# Patient Record
Sex: Female | Born: 2000 | Race: Black or African American | Hispanic: No | Marital: Single | Smoking: Never smoker
Health system: Southern US, Community
[De-identification: ages and names within clinical notes are randomized; demographics above are authoritative.]

## PROBLEM LIST (undated history)

## (undated) DIAGNOSIS — F32A Depression, unspecified: Secondary | ICD-10-CM

## (undated) DIAGNOSIS — S92909A Unspecified fracture of unspecified foot, initial encounter for closed fracture: Secondary | ICD-10-CM

## (undated) DIAGNOSIS — R45851 Suicidal ideations: Secondary | ICD-10-CM

## (undated) HISTORY — PX: ORTHOPEDIC SURGERY: SHX850

---
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
## 2020-01-17 ENCOUNTER — Other Ambulatory Visit (INDEPENDENT_AMBULATORY_CARE_PROVIDER_SITE_OTHER): Payer: Self-pay | Admitting: Family Medicine

## 2020-02-04 ENCOUNTER — Other Ambulatory Visit (INDEPENDENT_AMBULATORY_CARE_PROVIDER_SITE_OTHER): Payer: Self-pay | Admitting: Pediatric Infectious Disease

## 2020-02-04 ENCOUNTER — Other Ambulatory Visit (INDEPENDENT_AMBULATORY_CARE_PROVIDER_SITE_OTHER): Payer: Self-pay | Admitting: Family Medicine

## 2020-04-04 ENCOUNTER — Other Ambulatory Visit (INDEPENDENT_AMBULATORY_CARE_PROVIDER_SITE_OTHER): Payer: Self-pay | Admitting: Family Medicine

## 2020-04-04 DIAGNOSIS — N940 Mittelschmerz: Secondary | ICD-10-CM

## 2020-04-07 ENCOUNTER — Encounter (INDEPENDENT_AMBULATORY_CARE_PROVIDER_SITE_OTHER): Payer: Self-pay | Admitting: Family Medicine

## 2020-04-07 ENCOUNTER — Other Ambulatory Visit (INDEPENDENT_AMBULATORY_CARE_PROVIDER_SITE_OTHER): Payer: Self-pay | Admitting: Family Medicine

## 2020-04-07 DIAGNOSIS — N940 Mittelschmerz: Secondary | ICD-10-CM
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
## 2020-04-10 ENCOUNTER — Ambulatory Visit (INDEPENDENT_AMBULATORY_CARE_PROVIDER_SITE_OTHER): Payer: TRICARE Prime—HMO | Admitting: Family Medicine

## 2020-04-10 ENCOUNTER — Encounter (INDEPENDENT_AMBULATORY_CARE_PROVIDER_SITE_OTHER): Payer: Self-pay | Admitting: Family Medicine

## 2020-04-10 VITALS — BP 107/73 | HR 69 | Temp 97.4°F | Wt 129.4 lb

## 2020-04-10 DIAGNOSIS — Z3009 Encounter for other general counseling and advice on contraception: Secondary | ICD-10-CM

## 2020-04-10 MED ORDER — NORGESTIMATE-ETH ESTRADIOL 0.25-35 MG-MCG PO TABS
1.0000 | ORAL_TABLET | Freq: Every day | ORAL | 11 refills | Status: DC
Start: 2020-04-10 — End: 2021-02-15

## 2020-04-10 MED ORDER — NORGESTIMATE-ETH ESTRADIOL 0.25-35 MG-MCG PO TABS
1.0000 | ORAL_TABLET | Freq: Every day | ORAL | 5 refills | Status: DC
Start: 2020-04-10 — End: 2020-04-10

## 2020-04-10 NOTE — Progress Notes (Signed)
Have you seen any specialists/other providers since your last visit with [place]?    No    Arm preference verified?   Yes    The patient is due for DTAp , HpV series

## 2020-04-10 NOTE — Patient Instructions (Signed)
Estarylla 28 Day Pack   Uses  This medicine is used for the following purposes:   acne   birth control   depression   endocrine disorder   menstrual problem  Instructions  This medicine may be taken with or without food.  It is very important that you take the medicine at about the same time every day. It will work best if you do this.  Keep the medicine at room temperature. Avoid heat and direct light.  If this is the first time that you are using this medicine, please speak with your doctor about when you should start this medicine.  If you are switching from a different birth control pill, start this medicine on the same day you would start your other birth control pack.  If you are using a birth control pack with 28 pills, start the first pill of a new pack on the day after the last pill of the previous pack.  If you are using a birth control pack that has 21 pills, wait 7 days after taking the last pill of the empty pack before you take the first pill of a new pack.  If you forget to take a dose, take it as soon as you remember. If it is time for your next dose, you are allowed to take 2 doses at once. Return to your normal dosing schedule on the following day. If you miss more than one dose, ask your doctor what you should do to get back on schedule.  Speak with your doctor about using additional forms of birth control if you forget one or more doses of your medicine.  Some patients may have mild vaginal bleeding or spotting while on this medicine. If this happens, do not stop taking this medicine. If the bleeding or spotting lasts more than a few days or becomes heavier, contact your doctor.  This medicine may cause dark patches to appear on your face. Avoid sunlight and use sunscreen lotion to minimize further darkening of these skin patches.  Please tell your doctor and pharmacist about all the medicines you take. Include both prescription and over-the-counter medicines. Also tell them about any  vitamins, herbal medicines, or anything else you take for your health.  This medicine may affect your blood sugar levels. If you have diabetes, talk to your doctor before changing the dose of your diabetes medicine.  This medicine may interfere with some lab test results. Be sure to tell all your healthcare providers that you are taking this medicine.  It is very important that you keep all appointments for medical exams and tests while on this medicine.  Cautions  Tell your doctor and pharmacist if you ever had an allergic reaction to a medicine. Symptoms of an allergic reaction can include trouble breathing, skin rash, itching, swelling, or severe dizziness.  Avoid smoking while on this medicine. Smoking may increase your risk for stroke, heart attack, blood clots, high blood pressure, and other diseases of the heart and blood vessels.  If you miss your period while on this medicine, contact your doctor.  Ask your doctor to show you how to perform a self breast examination. You should check your breasts once a month and report any changes to your doctor.  Talk to your doctor about getting a complete physical exam every year while on this medicine.  Use condoms to reduce the risk of sexually transmitted diseases.  Tell the doctor or pharmacist if you are pregnant, planning to be  pregnant, or breastfeeding.  Do not use this medicine if you are pregnant. If you become pregnant while on this medicine, contact your doctor immediately.  Do not take St. John's wort while on this medicine.  Ask your pharmacist if this medicine can interact with any of your other medicines. Be sure to tell them about all the medicines you take.  Please tell all your doctors and dentists that you are on this medicine before they provide care.  Do not start or stop any other medicines without first speaking to your doctor or pharmacist.  Do not share this medicine with anyone who has not been prescribed this medicine.  Side Effects  The  following is a list of some common side effects from this medicine. Please speak with your doctor about what you should do if you experience these or other side effects.   bloating   breast pain or swelling   swelling of the legs, feet, and hands   headaches   high blood pressure   menstruation changes (missed or fewer periods)   nausea   stomach upset or abdominal pain   vaginal bleeding or spotting between periods   vomiting   weight gain  Call your doctor or get medical help right away if you notice any of these more serious side effects:   severe or persistent abdominal pain   breast lumps   chest pain   changes in memory, mood, or thinking   depression or feeling sad   fainting   severe or persistent headache   jaw pain   sudden leg pain, swelling, warmth or redness   symptoms of liver damage (such as yellowing of skin or eyes, dark urine, unusual tiredness or weakness; severe stomach or back pain)   shortness of breath   symptoms of stroke (such as one-sided weakness, slurred speech, confusion)   blurring or changes of vision  A few people may have an allergic reactions to this medicine. Symptoms can include difficulty breathing, skin rash, itching, swelling, or severe dizziness. If you notice any of these symptoms, seek medical help quickly.  Extra  Please speak with your doctor, nurse, or pharmacist if you have any questions about this medicine.  [email or web address]  IMPORTANT NOTE: This document tells you briefly how to take your medicine, but it does not tell you all there is to know about it.Your doctor or pharmacist may give you other documents about your medicine. Please talk to them if you have any questions.Always follow their advice. There is a more complete description of this medicine available in [place].Scan this code on your smartphone or tablet or use the web address below. You can also ask your pharmacist for a printout. If you have any  questions, please ask your pharmacist.    2021 [employer]

## 2020-05-10 ENCOUNTER — Other Ambulatory Visit (INDEPENDENT_AMBULATORY_CARE_PROVIDER_SITE_OTHER): Payer: Self-pay | Admitting: Family Medicine

## 2020-05-10 DIAGNOSIS — Z3009 Encounter for other general counseling and advice on contraception: Secondary | ICD-10-CM

## 2020-06-04 ENCOUNTER — Other Ambulatory Visit (INDEPENDENT_AMBULATORY_CARE_PROVIDER_SITE_OTHER): Payer: Self-pay | Admitting: Family Medicine

## 2020-06-04 DIAGNOSIS — Z3009 Encounter for other general counseling and advice on contraception: Secondary | ICD-10-CM

## 2020-06-04 NOTE — Telephone Encounter (Signed)
Please look at last medication refill- there are 11 refills available

## 2020-06-13 ENCOUNTER — Other Ambulatory Visit (INDEPENDENT_AMBULATORY_CARE_PROVIDER_SITE_OTHER): Payer: Self-pay | Admitting: Family Medicine

## 2020-06-13 DIAGNOSIS — Z3009 Encounter for other general counseling and advice on contraception: Secondary | ICD-10-CM
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
## 2020-09-24 ENCOUNTER — Ambulatory Visit (INDEPENDENT_AMBULATORY_CARE_PROVIDER_SITE_OTHER): Payer: TRICARE Prime—HMO | Admitting: Nurse Practitioner

## 2020-09-24 ENCOUNTER — Encounter (INDEPENDENT_AMBULATORY_CARE_PROVIDER_SITE_OTHER): Payer: Self-pay | Admitting: Nurse Practitioner

## 2020-09-24 VITALS — BP 128/80 | HR 74 | Temp 97.3°F | Ht 60.63 in | Wt 129.8 lb

## 2020-09-24 DIAGNOSIS — G43009 Migraine without aura, not intractable, without status migrainosus: Secondary | ICD-10-CM

## 2020-09-24 LAB — FERRITIN: Ferritin: 11.4 ng/mL (ref 4.60–204.00)

## 2020-09-24 LAB — CBC AND DIFFERENTIAL
Absolute NRBC: 0 10*3/uL (ref 0.00–0.00)
Basophils Absolute Automated: 0.05 10*3/uL (ref 0.00–0.08)
Basophils Automated: 1.1 %
Eosinophils Absolute Automated: 0.15 10*3/uL (ref 0.00–0.44)
Eosinophils Automated: 3.4 %
Hematocrit: 37.8 % (ref 34.7–43.7)
Hgb: 12.6 g/dL (ref 11.4–14.8)
Immature Granulocytes Absolute: 0.01 10*3/uL (ref 0.00–0.07)
Immature Granulocytes: 0.2 %
Lymphocytes Absolute Automated: 2.57 10*3/uL (ref 0.42–3.22)
Lymphocytes Automated: 59.1 %
MCH: 29.3 pg (ref 25.1–33.5)
MCHC: 33.3 g/dL (ref 31.5–35.8)
MCV: 87.9 fL (ref 78.0–96.0)
MPV: 8.5 fL — ABNORMAL LOW (ref 8.9–12.5)
Monocytes Absolute Automated: 0.37 10*3/uL (ref 0.21–0.85)
Monocytes: 8.5 %
Neutrophils Absolute: 1.2 10*3/uL (ref 1.10–6.33)
Neutrophils: 27.7 %
Nucleated RBC: 0 /100 WBC (ref 0.0–0.0)
Platelets: 302 10*3/uL (ref 142–346)
RBC: 4.3 10*6/uL (ref 3.90–5.10)
RDW: 12 % (ref 11–15)
WBC: 4.35 10*3/uL (ref 3.10–9.50)

## 2020-09-24 LAB — LYME AB, TOTAL,REFLEX TO WESTERN BLOT (IGG & IGM): Lyme AB,Total,Reflx to WB(IGM): 0.19

## 2020-09-24 MED ORDER — AMITRIPTYLINE HCL 25 MG PO TABS
25.0000 mg | ORAL_TABLET | Freq: Every evening | ORAL | 1 refills | Status: DC
Start: 2020-09-24 — End: 2021-09-15

## 2020-09-24 MED ORDER — BUTALBITAL-ASPIRIN-CAFFEINE 50-325-40 MG PO CAPS
1.00 | ORAL_CAPSULE | ORAL | 2 refills | Status: DC | PRN
Start: 2020-09-24 — End: 2021-09-15

## 2020-09-24 NOTE — Progress Notes (Signed)
Have you seen any specialists/other providers since your last visit with us?    No    Arm preference verified?   Yes    The patient is due for  HPV series

## 2020-09-25 NOTE — Progress Notes (Signed)
Hello Amanda Farrell, the test for lyme disease is negative.  The cbc and ferritin indicate that you do not have anemia.  Please schedule the CT scan of the head.  Have a headache free day!!! [name]

## 2020-10-16 ENCOUNTER — Ambulatory Visit: Payer: TRICARE Prime—HMO

## 2020-11-13 ENCOUNTER — Ambulatory Visit: Admission: RE | Admit: 2020-11-13 | Payer: TRICARE Prime—HMO | Source: Ambulatory Visit

## 2021-02-14 ENCOUNTER — Other Ambulatory Visit (INDEPENDENT_AMBULATORY_CARE_PROVIDER_SITE_OTHER): Payer: Self-pay | Admitting: Family Medicine

## 2021-02-14 DIAGNOSIS — Z3009 Encounter for other general counseling and advice on contraception: Secondary | ICD-10-CM

## 2021-05-29 ENCOUNTER — Other Ambulatory Visit (INDEPENDENT_AMBULATORY_CARE_PROVIDER_SITE_OTHER): Payer: Self-pay | Admitting: Nurse Practitioner

## 2021-07-19 ENCOUNTER — Emergency Department (HOSPITAL_COMMUNITY)

## 2021-07-19 ENCOUNTER — Other Ambulatory Visit: Payer: Self-pay

## 2021-07-19 ENCOUNTER — Encounter (HOSPITAL_COMMUNITY): Payer: Self-pay | Admitting: Emergency Medicine

## 2021-07-19 ENCOUNTER — Emergency Department (HOSPITAL_COMMUNITY)
Admission: EM | Admit: 2021-07-19 | Discharge: 2021-07-19 | Disposition: A | Discharge status: Home / Self Care | Attending: Emergency Medicine | Admitting: Emergency Medicine

## 2021-07-19 DIAGNOSIS — Z20822 Contact with and (suspected) exposure to covid-19: Secondary | ICD-10-CM | POA: Diagnosis not present

## 2021-07-19 DIAGNOSIS — R0602 Shortness of breath: Secondary | ICD-10-CM

## 2021-07-19 DIAGNOSIS — T7840XA Allergy, unspecified, initial encounter: Secondary | ICD-10-CM

## 2021-07-19 DIAGNOSIS — H608X1 Other otitis externa, right ear: Secondary | ICD-10-CM | POA: Insufficient documentation

## 2021-07-19 DIAGNOSIS — H60501 Unspecified acute noninfective otitis externa, right ear: Secondary | ICD-10-CM

## 2021-07-19 LAB — CBC WITH DIFFERENTIAL/PLATELET
Abs Immature Granulocytes: 0.04 10*3/uL (ref 0.00–0.07)
Basophils Absolute: 0 10*3/uL (ref 0.0–0.1)
Basophils Relative: 0 %
Eosinophils Absolute: 0.1 10*3/uL (ref 0.0–0.5)
Eosinophils Relative: 1 %
HCT: 33.5 % — ABNORMAL LOW (ref 36.0–46.0)
Hemoglobin: 11.7 g/dL — ABNORMAL LOW (ref 12.0–15.0)
Immature Granulocytes: 0 %
Lymphocytes Relative: 15 %
Lymphs Abs: 1.5 10*3/uL (ref 0.7–4.0)
MCH: 30.2 pg (ref 26.0–34.0)
MCHC: 34.9 g/dL (ref 30.0–36.0)
MCV: 86.3 fL (ref 80.0–100.0)
Monocytes Absolute: 0.6 10*3/uL (ref 0.1–1.0)
Monocytes Relative: 6 %
Neutro Abs: 7.7 10*3/uL (ref 1.7–7.7)
Neutrophils Relative %: 78 %
Platelets: 229 10*3/uL (ref 150–400)
RBC: 3.88 MIL/uL (ref 3.87–5.11)
RDW: 11.7 % (ref 11.5–15.5)
WBC: 10 10*3/uL (ref 4.0–10.5)
nRBC: 0 % (ref 0.0–0.2)

## 2021-07-19 LAB — BASIC METABOLIC PANEL
Anion gap: 7 (ref 5–15)
BUN: 5 mg/dL — ABNORMAL LOW (ref 6–20)
CO2: 23 mmol/L (ref 22–32)
Calcium: 8.9 mg/dL (ref 8.9–10.3)
Chloride: 108 mmol/L (ref 98–111)
Creatinine, Ser: 0.87 mg/dL (ref 0.44–1.00)
GFR, Estimated: >60 mL/min (ref >60)
Glucose, Bld: 78 mg/dL (ref 70–99)
Potassium: 3.9 mmol/L (ref 3.5–5.1)
Sodium: 138 mmol/L (ref 135–145)

## 2021-07-19 LAB — RESP PANEL BY RT-PCR (FLU A&B, COVID) ARPGX2
Influenza A by PCR: NEGATIVE
Influenza B by PCR: NEGATIVE
SARS Coronavirus 2 by RT PCR: NEGATIVE

## 2021-07-19 LAB — I-STAT BETA HCG BLOOD, ED (MC, WL, AP ONLY): I-stat hCG, quantitative: <5 m[IU]/mL (ref <5)

## 2021-07-19 LAB — GROUP A STREP BY PCR: Group A Strep by PCR: NOT DETECTED

## 2021-07-19 MED ORDER — DIPHENHYDRAMINE HCL 50 MG/ML IJ SOLN
25.0000 mg | Freq: Once | INTRAMUSCULAR | Status: AC
  Administered 2021-07-19: 25 mg via INTRAVENOUS
  Filled 2021-07-19: qty 1

## 2021-07-19 MED ORDER — FAMOTIDINE IN NACL 20-0.9 MG/50ML-% IV SOLN
20.0000 mg | Freq: Once | INTRAVENOUS | Status: AC
  Administered 2021-07-19: 20 mg via INTRAVENOUS
  Filled 2021-07-19: qty 50

## 2021-07-19 MED ORDER — DIPHENHYDRAMINE HCL 25 MG PO TABS: 25.0000 mg | ORAL_TABLET | Freq: Every evening | ORAL | 0 refills | Status: DC | PRN

## 2021-07-19 MED ORDER — PREDNISONE 10 MG PO TABS: 30.0000 mg | ORAL_TABLET | Freq: Every day | ORAL | 0 refills | Status: AC

## 2021-07-19 MED ORDER — ONDANSETRON HCL 4 MG/2ML IJ SOLN
4.0000 mg | Freq: Once | INTRAMUSCULAR | Status: AC
  Administered 2021-07-19: 4 mg via INTRAVENOUS
  Filled 2021-07-19: qty 2

## 2021-07-19 MED ORDER — ACETAMINOPHEN 500 MG PO TABS
1000.0000 mg | ORAL_TABLET | Freq: Once | ORAL | Status: AC
  Administered 2021-07-19: 1000 mg via ORAL
  Filled 2021-07-19: qty 2

## 2021-07-19 MED ORDER — CIPROFLOXACIN-DEXAMETHASONE 0.3-0.1 % OT SUSP
4.0000 [drp] | Freq: Once | OTIC | Status: AC
  Administered 2021-07-19: 4 [drp] via OTIC
  Filled 2021-07-19: qty 7.5

## 2021-07-19 MED ORDER — CETIRIZINE HCL 10 MG PO TABS: 10.0000 mg | ORAL_TABLET | Freq: Every day | ORAL | 0 refills | Status: DC

## 2021-07-19 MED ORDER — METHYLPREDNISOLONE SODIUM SUCC 125 MG IJ SOLR
125.0000 mg | Freq: Once | INTRAMUSCULAR | Status: AC
  Administered 2021-07-19: 125 mg via INTRAVENOUS
  Filled 2021-07-19: qty 2

## 2021-07-19 MED ORDER — CIPROFLOXACIN-DEXAMETHASONE 0.3-0.1 % OT SUSP: 4.0000 [drp] | Freq: Two times a day (BID) | OTIC | 0 refills | Status: DC

## 2021-07-19 NOTE — ED Notes (Signed)
Up to b/r with mother, steady gait. Blood and swabs sent.

## 2021-07-19 NOTE — ED Provider Notes (Signed)
[facility] EMERGENCY DEPARTMENT Provider Note   CSN: [id number] Arrival date & time: 07/19/21  7564     History Chief Complaint  Patient presents with   Shortness of Breath   Sore Throat    Erika Medina is a 20 y.o. female.  HPI Patient is a 20 year old female with no pertinent past medical history presented to the emergency room today with complaints of intermittent waxing waning shortness of breath, cough, sore throat, trouble swallowing--although able to tolerate p.o. fluids and food and pills--that started on Thursday and states that has been waxing and waning since.  She states that she feels that her face is puffy.  She states no history of anaphylaxis, asthma, allergies, atopic dermatitis. No recent new or unusual foods.  No recent animal exposure.  No recent travel.  No insect bites and no new medications started.  She denies any chest pain nausea vomiting diarrhea rashes No hemoptysis.  No other associate symptoms.  No aggravating mitigating factors.  Symptoms seem to wax and wane without provocation.    History reviewed. No pertinent past medical history.  There are no problems to display for this patient.   History reviewed. No pertinent surgical history.   OB History   No obstetric history on file.     No family history on file.  Social History   Tobacco Use   Smoking status: Never   Smokeless tobacco: Never  Substance Use Topics   Alcohol use: Not Currently   Drug use: Not Currently    Home Medications Prior to Admission medications   Medication Sig Start Date End Date Taking? Authorizing Provider  cetirizine (ZYRTEC ALLERGY) 10 MG tablet Take 1 tablet (10 mg total) by mouth daily. 07/19/21  Yes Jazzy Parmer, Stevphen Meuse S, PA  ciprofloxacin-dexamethasone (CIPRODEX) OTIC suspension Place 4 drops into the right ear 2 (two) times daily. 07/19/21  Yes Tylique Aull S, PA  diphenhydrAMINE (BENADRYL) 25 MG tablet Take 1 tablet (25 mg total) by  mouth at bedtime as needed. 07/19/21  Yes Wolf Boulay S, PA  predniSONE (DELTASONE) 10 MG tablet Take 3 tablets (30 mg total) by mouth daily for 4 days. 07/19/21 07/23/21 Yes Gailen Shelter, PA    Allergies    Patient has no allergy information on record.  Review of Systems   Review of Systems  Constitutional:  Positive for fatigue. Negative for chills and fever.  HENT:  Negative for congestion.        "Face swelling"  Eyes:  Negative for pain.  Respiratory:  Positive for cough and shortness of breath.   Cardiovascular:  Negative for chest pain and leg swelling.  Gastrointestinal:  Negative for abdominal pain, diarrhea, nausea and vomiting.  Genitourinary:  Negative for dysuria.  Musculoskeletal:  Negative for myalgias.  Skin:  Negative for rash.  Neurological:  Negative for dizziness and headaches.   Physical Exam Updated Vital Signs BP 131/77 (BP Location: Left Arm)   Pulse 74   Temp 98.9 F (37.2 C) (Oral)   Resp 18   LMP 07/05/2021   SpO2 98%   Physical Exam Vitals and nursing note reviewed.  Constitutional:      General: She is not in acute distress.    Comments: Anxious but well-appearing 20 year old female in no acute distress Speaking in full sentences.  Nontoxic-appearing.  HENT:     Head: Normocephalic and atraumatic.     Ears:     Comments: Right EAC with small ulcerative lesion is nonbleeding.  There  is some macerated tissue here.    Nose: Nose normal.     Mouth/Throat:     Comments: Mild posterior pharyngeal erythema with faint cobblestoning No tonsillar hypertrophy.  Uvula midline. Eyes:     General: No scleral icterus. Cardiovascular:     Rate and Rhythm: Normal rate and regular rhythm.     Pulses: Normal pulses.     Heart sounds: Normal heart sounds.  Pulmonary:     Effort: Pulmonary effort is normal. No respiratory distress.     Breath sounds: No wheezing.     Comments: Speaking in full sentences, no increased work of breathing, not  tachypneic, lungs clear to auscultation all fields  Initially some forced expiratory wheezing at end expiration however with coaxing patient ceased to do this. Abdominal:     Palpations: Abdomen is soft.     Tenderness: There is no abdominal tenderness. There is no guarding or rebound.  Musculoskeletal:     Cervical back: Normal range of motion.     Right lower leg: No edema.     Left lower leg: No edema.     Comments: No lower extremity edema  Skin:    General: Skin is warm and dry.     Capillary Refill: Capillary refill takes less than 2 seconds.     Comments: No rashes  Neurological:     Mental Status: She is alert. Mental status is at baseline.  Psychiatric:        Mood and Affect: Mood normal.        Behavior: Behavior normal.    ED Results / Procedures / Treatments   Labs (all labs ordered are listed, but only abnormal results are displayed) Labs Reviewed  BASIC METABOLIC PANEL - Abnormal; Notable for the following components:      Result Value   BUN 5 (*)    All other components within normal limits  CBC WITH DIFFERENTIAL/PLATELET - Abnormal; Notable for the following components:   Hemoglobin 11.7 (*)    HCT 33.5 (*)    All other components within normal limits  RESP PANEL BY RT-PCR (FLU A&B, COVID) ARPGX2  GROUP A STREP BY PCR  I-STAT BETA HCG BLOOD, ED ([facility], [facility], [facility] ONLY)    EKG None  Radiology DG Chest Port 1 View  Result Date: 07/19/2021 CLINICAL DATA:  Headache, cough, body aches EXAM: PORTABLE CHEST 1 VIEW COMPARISON:  None. FINDINGS: The cardiomediastinal silhouette is normal. There is no focal consolidation or pulmonary edema. There is no pleural effusion or pneumothorax. There is no acute osseous abnormality. IMPRESSION: No radiographic evidence of acute cardiopulmonary process. Electronically Signed   By: Lesia Hausen M.D.   On: 07/19/2021 09:50    Procedures Procedures   Medications Ordered in ED Medications  methylPREDNISolone sodium succinate  (SOLU-MEDROL) 125 mg/2 mL injection 125 mg (125 mg Intravenous Given 07/19/21 0959)  famotidine (PEPCID) IVPB 20 mg premix (0 mg Intravenous Stopped 07/19/21 1050)  ondansetron (ZOFRAN) injection 4 mg (4 mg Intravenous Given 07/19/21 0958)  diphenhydrAMINE (BENADRYL) injection 25 mg (25 mg Intravenous Given 07/19/21 0959)  acetaminophen (TYLENOL) tablet 1,000 mg (1,000 mg Oral Given 07/19/21 1235)  ciprofloxacin-dexamethasone (CIPRODEX) 0.3-0.1 % OTIC (EAR) suspension 4 drop (4 drops Right EAR Given 07/19/21 1435)    ED Course  I have reviewed the triage vital signs and the nursing notes.  Pertinent labs & imaging results that were available during my care of the patient were reviewed by me and considered in my medical decision  making (see chart for details).    MDM Rules/Calculators/A&P                          Patient is a well-appearing 20 year old female no past medical history presented to the ER today with complaints of which she describes as waxing and waning shortness of breath some cough sore throat has been ongoing for 4 days now.  No wheezing on examination no swelling of face no rashes no nausea or vomiting patient is not anaphylactic.  To provide her with some Pepcid, Benadryl some Tylenol and Zofran also given Solu-Medrol.  On my reassessment she feels significantly better.  States she has she has no symptoms at this time apart from some right ear pain which she just noticed.   I personally reviewed all laboratory work and imaging.  Metabolic panel without any acute abnormality specifically kidney function within normal limits and no significant electrolyte abnormalities. CBC without leukocytosis or significant anemia.   COVID/FLU/Strep negative.   Hcg negative for preg.   CXR negative   EKG nonischemic,  sinus arrhythmia.   Patient reassessed continues to have no respiratory abnormalities such as tachypnea, increased work of breathing or wheezing.  States he  feels completely resolved from the facial swelling.  Her face appears unchanged on my examination.  I am reassured that her symptoms are improved.  We will discharge patient home with short course of low intensity prednisone, Benadryl, Zyrtec and recommendations follow-up with allergist and PCP for  Also provide patient with Ciprodex for possible otitis externa.  Tabby Beaston was evaluated in Emergency Department on 07/19/2021 for the symptoms described in the history of present illness. She was evaluated in the context of the global COVID-19 pandemic, which necessitated consideration that the patient might be at risk for infection with the SARS-CoV-2 virus that causes COVID-19. Institutional protocols and algorithms that pertain to the evaluation of patients at risk for COVID-19 are in a state of rapid change based on information released by regulatory bodies including the CDC and federal and state organizations. These policies and algorithms were followed during the patient's care in the ED.   Final Clinical Impression(s) / ED Diagnoses Final diagnoses:  Allergic reaction, initial encounter  Acute otitis externa of right ear, unspecified type    Rx / DC Orders ED Discharge Orders          Ordered    cetirizine (ZYRTEC ALLERGY) 10 MG tablet  Daily        07/19/21 1410    diphenhydrAMINE (BENADRYL) 25 MG tablet  At bedtime PRN        07/19/21 1410    predniSONE (DELTASONE) 10 MG tablet  Daily        07/19/21 1410    ciprofloxacin-dexamethasone (CIPRODEX) OTIC suspension  2 times daily        07/19/21 1427             Solon [place]a [place], [place] 07/19/21 1524    Margarita Grizzle, MD 07/19/21 1525

## 2021-07-19 NOTE — ED Notes (Signed)
Lab contacted re: delay in BMP results running on machine per lab

## 2021-07-19 NOTE — ED Triage Notes (Signed)
Pt reports sore throat, difficulty swallowing, headache, cough, and body aches that started on Thursday and has worsened.  Pt now has facial swelling and throat swelling.  Wheezing.

## 2021-07-19 NOTE — ED Provider Notes (Signed)
Emergency Medicine Provider Triage Evaluation Note  Erika Medina , a 20 y.o. female  was evaluated in triage.  Pt complains of shortness of breath, cough, trouble swallowing.  Started on Thursday, had been worsening since then.  Also endorses some facial swelling.  No history of anaphylaxis or asthma, patient does have some allergies.  Denies any triggering foods, no new exposures or insect bites..  Review of Systems  Positive: Shortness of breath, cough, trouble swallowing, facial swelling Negative: Chest pain, nausea, vomiting, rash  Physical Exam  BP (!) 143/101 (BP Location: Left Arm)   Temp 98.4 F (36.9 C) (Oral)   Resp 19  Gen:   Awake, no distress   Resp:  Normal effort  MSK:   Moves extremities without difficulty  Other:  Some wheezing on exam, mild facial swelling.  Uvula midline.  Handling secretions appropriately.  Medical Decision Making  Medically screening exam initiated at 9:28 AM.  Appropriate orders placed.  Erika Medina was informed that the remainder of the evaluation will be completed by another provider, this initial triage assessment does not replace that evaluation, and the importance of remaining in the ED until their evaluation is complete.  Will treat as allergic reaction, does not meet 2 body system requirement for anaphylaxis.  Will also get chest x-ray given wheezing and COVID test and EKG for shortness of breath.     Erika Arista, PA-C 07/19/21 0932    Erika Core, MD 07/19/21 2119

## 2021-07-19 NOTE — ED Notes (Signed)
Pt alert, NAD, calm, interactive, resps e/u, speaking in squeaky hoarse voice. VSS/WDL. Xray at [facility] : [facility], mother at [facility]. C/o sore throat ear and sinus pain, and headache. Denies nausea, sob or itching.

## 2021-07-19 NOTE — ED Notes (Signed)
Feel better, but still having sharp pain in R ear.

## 2021-07-19 NOTE — Discharge Instructions (Addendum)
Please take the medications I prescribed you as directed. I have given you a short course of prednisone to take.  Please use Benadryl 25 mg at bedtime each night for the next 4 days Please take 1 Zyrtec each morning.  Please follow-up with your primary care provider.  You may always return to the ER for any new or concerning symptoms.  I have provided you with a school note for tomorrow as needed however you may return to school tomorrow if you feel well.

## 2021-07-19 NOTE — ED Notes (Signed)
Up to b/r, steady gait. Alert, NAD, calm.

## 2021-09-15 ENCOUNTER — Ambulatory Visit (INDEPENDENT_AMBULATORY_CARE_PROVIDER_SITE_OTHER): Payer: TRICARE Prime—HMO | Admitting: Nurse Practitioner

## 2021-09-15 ENCOUNTER — Encounter (INDEPENDENT_AMBULATORY_CARE_PROVIDER_SITE_OTHER): Payer: Self-pay | Admitting: Nurse Practitioner

## 2021-09-15 VITALS — BP 126/79 | HR 87 | Temp 98.0°F | Resp 16 | Ht 61.42 in | Wt 151.2 lb

## 2021-09-15 DIAGNOSIS — R000 Tachycardia, unspecified: Secondary | ICD-10-CM

## 2021-09-15 DIAGNOSIS — E162 Hypoglycemia, unspecified: Secondary | ICD-10-CM

## 2021-09-15 DIAGNOSIS — L259 Unspecified contact dermatitis, unspecified cause: Secondary | ICD-10-CM | POA: Insufficient documentation

## 2021-09-15 DIAGNOSIS — J029 Acute pharyngitis, unspecified: Secondary | ICD-10-CM | POA: Insufficient documentation

## 2021-09-15 DIAGNOSIS — D573 Sickle-cell trait: Secondary | ICD-10-CM

## 2021-09-15 DIAGNOSIS — D649 Anemia, unspecified: Secondary | ICD-10-CM

## 2021-09-15 DIAGNOSIS — H9190 Unspecified hearing loss, unspecified ear: Secondary | ICD-10-CM | POA: Insufficient documentation

## 2021-09-15 DIAGNOSIS — G43009 Migraine without aura, not intractable, without status migrainosus: Secondary | ICD-10-CM

## 2021-09-15 DIAGNOSIS — Z0000 Encounter for general adult medical examination without abnormal findings: Secondary | ICD-10-CM

## 2021-09-15 LAB — CBC AND DIFFERENTIAL
Absolute NRBC: 0 10*3/uL (ref 0.00–0.00)
Basophils Absolute Automated: 0.02 10*3/uL (ref 0.00–0.08)
Basophils Automated: 0.4 %
Eosinophils Absolute Automated: 0.11 10*3/uL (ref 0.00–0.44)
Eosinophils Automated: 2.3 %
Hematocrit: 36.1 % (ref 34.7–43.7)
Hgb: 12.4 g/dL (ref 11.4–14.8)
Immature Granulocytes Absolute: 0.01 10*3/uL (ref 0.00–0.07)
Immature Granulocytes: 0.2 %
Lymphocytes Absolute Automated: 2 10*3/uL (ref 0.42–3.22)
Lymphocytes Automated: 42.5 %
MCH: 29.6 pg (ref 25.1–33.5)
MCHC: 34.3 g/dL (ref 31.5–35.8)
MCV: 86.2 fL (ref 78.0–96.0)
MPV: 8.4 fL — ABNORMAL LOW (ref 8.9–12.5)
Monocytes Absolute Automated: 0.34 10*3/uL (ref 0.21–0.85)
Monocytes: 7.2 %
Neutrophils Absolute: 2.23 10*3/uL (ref 1.10–6.33)
Neutrophils: 47.4 %
Nucleated RBC: 0 /100 WBC (ref 0.0–0.0)
Platelets: 273 10*3/uL (ref 142–346)
RBC: 4.19 10*6/uL (ref 3.90–5.10)
RDW: 12 % (ref 11–15)
WBC: 4.71 10*3/uL (ref 3.10–9.50)

## 2021-09-15 LAB — ECG 12-LEAD
Atrial Rate: 91 {beats}/min
IHS MUSE NARRATIVE AND IMPRESSION: NORMAL
P Axis: 70 degrees
P-R Interval: 138 ms
Q-T Interval: 374 ms
QRS Duration: 78 ms
QTC Calculation (Bezet): 460 ms
R Axis: 47 degrees
T Axis: 32 degrees
Ventricular Rate: 91 {beats}/min

## 2021-09-15 LAB — COMPREHENSIVE METABOLIC PANEL
ALT: 10 U/L (ref 0–55)
AST (SGOT): 16 U/L (ref 5–41)
Albumin/Globulin Ratio: 1.3 (ref 0.9–2.2)
Albumin: 4.1 g/dL (ref 3.5–5.0)
Alkaline Phosphatase: 50 U/L (ref 37–117)
Anion Gap: 5 (ref 5.0–15.0)
BUN: 5 mg/dL — ABNORMAL LOW (ref 7.0–21.0)
Bilirubin, Total: 0.4 mg/dL (ref 0.2–1.2)
CO2: 26 mEq/L (ref 17–29)
Calcium: 9.6 mg/dL (ref 8.5–10.5)
Chloride: 107 mEq/L (ref 99–111)
Creatinine: 0.8 mg/dL (ref 0.4–1.0)
Globulin: 3.1 g/dL (ref 2.0–3.6)
Glucose: 80 mg/dL (ref 70–100)
Potassium: 4.2 mEq/L (ref 3.5–5.3)
Protein, Total: 7.2 g/dL (ref 6.0–8.3)
Sodium: 138 mEq/L (ref 135–145)

## 2021-09-15 LAB — HBA1C WITH AEG FOR HEMOGLOBINOPATHY PATIENTS
Average Estimated Glucose - Hemoglobinopathy Patient: 96.8 mg/dL
Hemoglobin A1C by Boronate Affinity: 5 % (ref 4.6–5.9)

## 2021-09-15 LAB — GFR: EGFR: >60

## 2021-09-15 LAB — FERRITIN: Ferritin: 12.4 ng/mL (ref 4.60–204.00)

## 2021-09-15 LAB — HEMOLYSIS INDEX: Hemolysis Index: 6 Index (ref 0–24)
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
## 2021-09-16 ENCOUNTER — Encounter (INDEPENDENT_AMBULATORY_CARE_PROVIDER_SITE_OTHER): Payer: Self-pay | Admitting: Family Medicine

## 2021-09-16 NOTE — Progress Notes (Signed)
Hello Amanda Farrell, The HA1C indicates no current evidence of diabetes or prediabetes.   The cmp indicates normal glucose electrolytes and liver and kidney function.  The cbc and ferritin indicate no current signs of anemia or infection. Have a nice day, [name]

## 2021-09-20 LAB — FRUCTOSAMINE: Fructosamine: 265 umol/L (ref 205–285)

## 2021-10-06 ENCOUNTER — Ambulatory Visit
Admission: RE | Admit: 2021-10-06 | Discharge: 2021-10-06 | Disposition: A | Discharge status: Home / Self Care | Payer: TRICARE Prime—HMO | Source: Ambulatory Visit | Attending: Nurse Practitioner | Admitting: Nurse Practitioner

## 2021-10-06 ENCOUNTER — Other Ambulatory Visit: Payer: Self-pay | Admitting: Nurse Practitioner

## 2021-10-06 DIAGNOSIS — G43009 Migraine without aura, not intractable, without status migrainosus: Secondary | ICD-10-CM | POA: Insufficient documentation

## 2021-10-07 NOTE — Progress Notes (Signed)
Hello Amanda Farrell, the MRI was free of any suspicious findings.  Please follow up with neurology and bring a copy of the MRI with you to the appointment. [address], [name]

## 2021-12-21 ENCOUNTER — Other Ambulatory Visit (INDEPENDENT_AMBULATORY_CARE_PROVIDER_SITE_OTHER): Payer: Self-pay | Admitting: Family Medicine

## 2021-12-21 DIAGNOSIS — Z3009 Encounter for other general counseling and advice on contraception: Secondary | ICD-10-CM

## 2021-12-22 MED ORDER — NORGESTIMATE-ETH ESTRADIOL 0.25-35 MG-MCG PO TABS
1.0000 | ORAL_TABLET | Freq: Every day | ORAL | 2 refills | Status: DC
Start: 2021-12-22 — End: 2022-03-29

## 2021-12-22 NOTE — Telephone Encounter (Signed)
Last visit 09/15/21    Rx refilled.

## 2021-12-29 ENCOUNTER — Other Ambulatory Visit (INDEPENDENT_AMBULATORY_CARE_PROVIDER_SITE_OTHER): Payer: Self-pay | Admitting: Nurse Practitioner

## 2021-12-29 DIAGNOSIS — Z3009 Encounter for other general counseling and advice on contraception: Secondary | ICD-10-CM

## 2021-12-30 NOTE — Telephone Encounter (Signed)
Mychart message sent.

## 2022-02-23 ENCOUNTER — Emergency Department
Admission: EM | Admit: 2022-02-23 | Discharge: 2022-02-24 | Disposition: A | Discharge status: Home / Self Care | Attending: Student in an Organized Health Care Education/Training Program | Admitting: Student in an Organized Health Care Education/Training Program

## 2022-02-23 DIAGNOSIS — H6692 Otitis media, unspecified, left ear: Secondary | ICD-10-CM | POA: Insufficient documentation

## 2022-02-23 DIAGNOSIS — R112 Nausea with vomiting, unspecified: Secondary | ICD-10-CM | POA: Insufficient documentation

## 2022-02-23 DIAGNOSIS — H6690 Otitis media, unspecified, unspecified ear: Secondary | ICD-10-CM

## 2022-02-23 LAB — HCG, SERUM, QUALITATIVE: Hcg Qualitative: NEGATIVE

## 2022-02-23 LAB — CBC AND DIFFERENTIAL
Absolute NRBC: 0 10*3/uL (ref 0.00–0.00)
Basophils Absolute Automated: 0.03 10*3/uL (ref 0.00–0.08)
Basophils Automated: 0.3 %
Eosinophils Absolute Automated: 0.07 10*3/uL (ref 0.00–0.44)
Eosinophils Automated: 0.8 %
Hematocrit: 34 % — ABNORMAL LOW (ref 34.7–43.7)
Hgb: 12.1 g/dL (ref 11.4–14.8)
Immature Granulocytes Absolute: 0.02 10*3/uL (ref 0.00–0.07)
Immature Granulocytes: 0.2 %
Instrument Absolute Neutrophil Count: 7.33 10*3/uL — ABNORMAL HIGH (ref 1.10–6.33)
Lymphocytes Absolute Automated: 0.92 10*3/uL (ref 0.42–3.22)
Lymphocytes Automated: 10.5 %
MCH: 29.5 pg (ref 25.1–33.5)
MCHC: 35.6 g/dL (ref 31.5–35.8)
MCV: 82.9 fL (ref 78.0–96.0)
MPV: 8.3 fL — ABNORMAL LOW (ref 8.9–12.5)
Monocytes Absolute Automated: 0.39 10*3/uL (ref 0.21–0.85)
Monocytes: 4.5 %
Neutrophils Absolute: 7.33 10*3/uL — ABNORMAL HIGH (ref 1.10–6.33)
Neutrophils: 83.7 %
Nucleated RBC: 0 /100 WBC (ref 0.0–0.0)
Platelets: 243 10*3/uL (ref 142–346)
RBC: 4.1 10*6/uL (ref 3.90–5.10)
RDW: 12 % (ref 11–15)
WBC: 8.76 10*3/uL (ref 3.10–9.50)

## 2022-02-23 LAB — BASIC METABOLIC PANEL
Anion Gap: 10 (ref 5.0–15.0)
BUN: 6 mg/dL — ABNORMAL LOW (ref 7.0–21.0)
CO2: 22 mEq/L (ref 17–29)
Calcium: 9.6 mg/dL (ref 8.5–10.5)
Chloride: 106 mEq/L (ref 99–111)
Creatinine: 0.9 mg/dL (ref 0.4–1.0)
Glucose: 80 mg/dL (ref 70–100)
Potassium: 4.1 mEq/L (ref 3.5–5.3)
Sodium: 138 mEq/L (ref 135–145)
eGFR: >60 mL/min/{1.73_m2} (ref >=60)

## 2022-02-23 MED ORDER — SODIUM CHLORIDE 0.9 % IV BOLUS
1000.0000 mL | Freq: Once | INTRAVENOUS | Status: AC
Start: 2022-02-23 — End: 2022-02-24
  Administered 2022-02-23: 1000 mL via INTRAVENOUS

## 2022-02-23 MED ORDER — ONDANSETRON HCL 4 MG/2ML IJ SOLN
4.0000 mg | Freq: Once | INTRAMUSCULAR | Status: AC
Start: 2022-02-23 — End: 2022-02-23
  Administered 2022-02-23: 4 mg via INTRAVENOUS
  Filled 2022-02-23: qty 2

## 2022-02-23 MED ORDER — AMOXICILLIN 500 MG PO CAPS
500.0000 mg | ORAL_CAPSULE | Freq: Once | ORAL | Status: AC
Start: 2022-02-23 — End: 2022-02-24
  Administered 2022-02-24: 500 mg via ORAL
  Filled 2022-02-23: qty 1
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
## 2022-02-23 NOTE — ED Triage Notes (Signed)
Presented with projectile vomiting for about 2 days as per patient. Had eaten mac n cheese and bread prior to symptoms started

## 2022-02-24 MED ORDER — SODIUM CHLORIDE 0.9 % IV SOLN
12.5000 mg | Freq: Once | INTRAVENOUS | Status: AC
Start: 2022-02-24 — End: 2022-02-24
  Administered 2022-02-24: 12.5 mg via INTRAVENOUS
  Filled 2022-02-24: qty 12.5

## 2022-02-24 MED ORDER — PROMETHAZINE HCL 12.5 MG PO TABS
12.5000 mg | ORAL_TABLET | Freq: Four times a day (QID) | ORAL | 0 refills | Status: AC | PRN
Start: 2022-02-24 — End: ?

## 2022-02-24 MED ORDER — AMOXICILLIN 500 MG PO CAPS
500.0000 mg | ORAL_CAPSULE | Freq: Three times a day (TID) | ORAL | 0 refills | Status: AC
Start: 2022-02-24 — End: 2022-03-06

## 2022-02-26 ENCOUNTER — Telehealth (INDEPENDENT_AMBULATORY_CARE_PROVIDER_SITE_OTHER): Payer: Self-pay | Admitting: Nurse Practitioner

## 2022-02-26 NOTE — Telephone Encounter (Signed)
Attempted to call patient for ED follow up visit. LMTCB.

## 2022-03-18 ENCOUNTER — Ambulatory Visit: Admitting: Neurology

## 2022-03-29 ENCOUNTER — Other Ambulatory Visit (INDEPENDENT_AMBULATORY_CARE_PROVIDER_SITE_OTHER): Payer: Self-pay | Admitting: Family Medicine

## 2022-03-29 DIAGNOSIS — Z3009 Encounter for other general counseling and advice on contraception: Secondary | ICD-10-CM

## 2022-03-29 NOTE — Telephone Encounter (Signed)
LOV- 09/15/21 advised to " Return if symptoms worsen or fail to improve."      Rx refilled

## 2022-04-16 ENCOUNTER — Ambulatory Visit (INDEPENDENT_AMBULATORY_CARE_PROVIDER_SITE_OTHER): Admitting: Family

## 2022-04-16 ENCOUNTER — Encounter (INDEPENDENT_AMBULATORY_CARE_PROVIDER_SITE_OTHER): Payer: Self-pay | Admitting: Family

## 2022-04-16 VITALS — BP 123/87 | HR 80 | Temp 97.8°F | Wt 140.2 lb

## 2022-04-16 DIAGNOSIS — E162 Hypoglycemia, unspecified: Secondary | ICD-10-CM

## 2022-04-16 LAB — COMPREHENSIVE METABOLIC PANEL
ALT: 6 U/L (ref 0–55)
AST (SGOT): 16 U/L (ref 5–41)
Albumin/Globulin Ratio: 1.4 (ref 0.9–2.2)
Albumin: 4.1 g/dL (ref 3.5–5.0)
Alkaline Phosphatase: 50 U/L (ref 37–117)
Anion Gap: 12 (ref 5.0–15.0)
BUN: 6 mg/dL — ABNORMAL LOW (ref 7.0–21.0)
Bilirubin, Total: 0.5 mg/dL (ref 0.2–1.2)
CO2: 20 mEq/L (ref 17–29)
Calcium: 9.5 mg/dL (ref 8.5–10.5)
Chloride: 105 mEq/L (ref 99–111)
Creatinine: 0.8 mg/dL (ref 0.4–1.0)
Globulin: 2.9 g/dL (ref 2.0–3.6)
Glucose: 132 mg/dL — ABNORMAL HIGH (ref 70–100)
Potassium: 3.5 mEq/L (ref 3.5–5.3)
Protein, Total: 7 g/dL (ref 6.0–8.3)
Sodium: 137 mEq/L (ref 135–145)
eGFR: >60 mL/min/{1.73_m2} (ref >=60)

## 2022-04-16 LAB — HEMOLYSIS INDEX: Hemolysis Index: 10 Index (ref 0–24)
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
## 2022-04-17 LAB — CBC AND DIFFERENTIAL
Absolute NRBC: 0 10*3/uL (ref 0.00–0.00)
Basophils Absolute Automated: 0.04 10*3/uL (ref 0.00–0.08)
Basophils Automated: 0.8 %
Eosinophils Absolute Automated: 0.03 10*3/uL (ref 0.00–0.44)
Eosinophils Automated: 0.6 %
Hematocrit: 33.7 % — ABNORMAL LOW (ref 34.7–43.7)
Hgb: 11.8 g/dL (ref 11.4–14.8)
Immature Granulocytes Absolute: 0.01 10*3/uL (ref 0.00–0.07)
Immature Granulocytes: 0.2 %
Instrument Absolute Neutrophil Count: 3.13 10*3/uL (ref 1.10–6.33)
Lymphocytes Absolute Automated: 1.68 10*3/uL (ref 0.42–3.22)
Lymphocytes Automated: 32.7 %
MCH: 29.4 pg (ref 25.1–33.5)
MCHC: 35 g/dL (ref 31.5–35.8)
MCV: 84 fL (ref 78.0–96.0)
MPV: 8.7 fL — ABNORMAL LOW (ref 8.9–12.5)
Monocytes Absolute Automated: 0.25 10*3/uL (ref 0.21–0.85)
Monocytes: 4.9 %
Neutrophils Absolute: 3.13 10*3/uL (ref 1.10–6.33)
Neutrophils: 60.8 %
Nucleated RBC: 0 /100 WBC (ref 0.0–0.0)
Platelets: 265 10*3/uL (ref 142–346)
RBC: 4.01 10*6/uL (ref 3.90–5.10)
RDW: 12 % (ref 11–15)
WBC: 5.14 10*3/uL (ref 3.10–9.50)

## 2022-04-17 LAB — HEMOGLOBIN A1C
Average Estimated Glucose: 85.3 mg/dL
Hemoglobin A1C: 4.6 % (ref 4.6–5.6)

## 2022-04-17 LAB — TSH: TSH: 0.54 u[IU]/mL (ref 0.35–4.94)

## 2022-06-10 ENCOUNTER — Other Ambulatory Visit (INDEPENDENT_AMBULATORY_CARE_PROVIDER_SITE_OTHER): Payer: Self-pay | Admitting: Nurse Practitioner

## 2022-06-17 ENCOUNTER — Ambulatory Visit: Admitting: Neurology

## 2022-06-18 ENCOUNTER — Other Ambulatory Visit (INDEPENDENT_AMBULATORY_CARE_PROVIDER_SITE_OTHER): Payer: Self-pay | Admitting: Family Medicine

## 2022-06-18 DIAGNOSIS — Z3009 Encounter for other general counseling and advice on contraception: Secondary | ICD-10-CM

## 2022-06-18 NOTE — Telephone Encounter (Signed)
LV- 09/15/21, Needs appt.

## 2022-07-03 ENCOUNTER — Other Ambulatory Visit (INDEPENDENT_AMBULATORY_CARE_PROVIDER_SITE_OTHER): Payer: Self-pay | Admitting: Family Medicine

## 2022-07-03 DIAGNOSIS — Z3009 Encounter for other general counseling and advice on contraception: Secondary | ICD-10-CM

## 2022-07-05 ENCOUNTER — Encounter (INDEPENDENT_AMBULATORY_CARE_PROVIDER_SITE_OTHER): Payer: Self-pay | Admitting: Family

## 2022-07-05 ENCOUNTER — Other Ambulatory Visit (INDEPENDENT_AMBULATORY_CARE_PROVIDER_SITE_OTHER): Payer: Self-pay | Admitting: Family Medicine

## 2022-07-05 DIAGNOSIS — Z3009 Encounter for other general counseling and advice on contraception: Secondary | ICD-10-CM

## 2022-07-05 MED ORDER — NORGESTIMATE-ETH ESTRADIOL 0.25-35 MG-MCG PO TABS
1.0000 | ORAL_TABLET | Freq: Every day | ORAL | 0 refills | Status: DC
Start: 2022-07-05 — End: 2022-07-20

## 2022-07-05 NOTE — Telephone Encounter (Signed)
Lv- 09/15/21, Needs transfer of care.

## 2022-07-05 NOTE — Addendum Note (Signed)
Addended by: Carney Bern on: 07/05/2022 04:12 PM     Modules accepted: Orders

## 2022-07-05 NOTE — Telephone Encounter (Signed)
LV- 09/15/21 with clair, needs transfer of care.    Message sent

## 2022-07-05 NOTE — Telephone Encounter (Signed)
Thirty days sent but pt needs to establish with a new provider prior to additional refills.

## 2022-07-06 NOTE — Telephone Encounter (Signed)
Message sent

## 2022-07-16 ENCOUNTER — Telehealth: Admitting: Physician Assistant

## 2022-07-16 DIAGNOSIS — Z3041 Encounter for surveillance of contraceptive pills: Secondary | ICD-10-CM | POA: Diagnosis not present

## 2022-07-16 MED ORDER — NORGESTIMATE-ETH ESTRADIOL 0.25-35 MG-MCG PO TABS: 1.0000 | ORAL_TABLET | Freq: Every day | ORAL | 0 refills | Status: AC

## 2022-07-16 NOTE — Patient Instructions (Signed)
Erika Medina, thank you for joining Margaretann Loveless, PA-C for today's virtual visit.  While this provider is not your primary care provider (PCP), if your PCP is located in our provider database this encounter information will be shared with them immediately following your visit.  A [facility] MyChart account gives you access to today's visit and all your visits, tests, and labs performed at [facility] " click here if you don't have a [facility] MyChart account or go to [email or web address]  Consent: (Patient) Erika Medina provided verbal consent for this virtual visit at the beginning of the encounter.  Current Medications:  Current Outpatient Medications:    norgestimate-ethinyl estradiol (ESTARYLLA) 0.25-35 MG-MCG tablet, Take 1 tablet by mouth daily., Disp: 84 tablet, Rfl: 0   cetirizine (ZYRTEC ALLERGY) 10 MG tablet, Take 1 tablet (10 mg total) by mouth daily., Disp: 10 tablet, Rfl: 0   ciprofloxacin-dexamethasone (CIPRODEX) OTIC suspension, Place 4 drops into the right ear 2 (two) times daily., Disp: 7.5 mL, Rfl: 0   diphenhydrAMINE (BENADRYL) 25 MG tablet, Take 1 tablet (25 mg total) by mouth at bedtime as needed., Disp: 10 tablet, Rfl: 0   Medications ordered in this encounter:  Meds ordered this encounter  Medications   norgestimate-ethinyl estradiol (ESTARYLLA) 0.25-35 MG-MCG tablet    Sig: Take 1 tablet by mouth daily.    Dispense:  84 tablet    Refill:  0    Order Specific Question:   Supervising Provider    Answer:   Merrilee Jansky [id number]     *If you need refills on other medications prior to your next appointment, please contact your pharmacy*  Follow-Up: Call back or seek an in-person evaluation if the symptoms worsen or if the condition fails to improve as anticipated.   Virtual Care [phone]  Other Instructions  Estarylla (Norgestimate; Ethinyl Estradiol Tablets) What is this medication? NORGESTIMATE; ETHINYL  ESTRADIOL (nor JES ti mate; ETH in il es tra DYE ole) prevents ovulation and pregnancy. It may also be used to treat acne. It belongs to a group of medications called oral contraceptives. It is a combination of the hormones estrogen and progestin. This medicine may be used for other purposes; ask your health care provider or pharmacist if you have questions. COMMON BRAND NAME(S): Estarylla, Mili, MONO-LINYAH, MonoNessa, Norgestimate/Ethinyl Estradiol, Ortho Tri-Cyclen, Ortho Tri-Cyclen Lo, Ortho-Cyclen, Previfem, Sprintec, Tri-Estarylla, TRI-LINYAH, Tri-Lo-Estarylla, Tri-Lo-Marzia, Tri-Lo-Mili, Tri-Lo-Sprintec, Tri-Mili, Tri-Previfem, Tri-Sprintec, Tri-VyLibra, Trinessa, Trinessa Althea Charon What should I tell my care team before I take this medication? They need to know if you have or ever had any of these conditions: Abnormal vaginal bleeding Blood vessel disease Breast, cervical, endometrial, ovarian, liver, or uterine cancer Diabetes Gallbladder disease Having surgery Heart disease or recent heart attack High blood pressure High cholesterol or triglycerides History of blood clots History of irregular heartbeat or heart valve problems History of a stroke Kidney disease Liver disease Migraine headaches Protein C deficiency Protein S deficiency Recently had a baby, miscarriage, or abortion Systemic lupus erythematosus (SLE) Tobacco smoker An unusual or allergic reaction to estrogens, progestins, other medications, foods, dyes, or preservatives Pregnant or trying to get pregnant Breast-feeding How should I use this medication? Take this medication by mouth. To reduce nausea, it can be taken with food. Follow the directions on the prescription label. Take this medication at the same time each day and in the order directed on the package. Do not take your medication more often than directed. Contact your care team about  the use of this medication in children. Special care may be needed.  This medication has been used in children who have started having menstrual periods. A patient package insert for the product will be given with each prescription and refill. Read this sheet carefully each time. The sheet may change frequently. Overdosage: If you think you have taken too much of this medicine contact a poison control center or emergency room at once. NOTE: This medicine is only for you. Do not share this medicine with others. What if I miss a dose? If you miss a dose, refer to the patient information sheet you received with your medication for directions on what to do. If you miss more than one dose, this medication may not be as effective, and you may need to use another form of birth control. What may interact with this medication? Do not take this medication with the following: Dasabuvir; ombitasvir; paritaprevir; ritonavir Ombitasvir; paritaprevir; ritonavir This medication may also interact with the following: Acetaminophen Antibiotics or medications for infections, especially rifampin, rifabutin, rifapentine, and griseofulvin, and possibly penicillins or tetracyclines Aprepitant Ascorbic acid (vitamin C) Atorvastatin Barbiturate medications, such as phenobarbital Bosentan Carbamazepine Caffeine Clofibrate Cyclosporine Dantrolene Doxercalciferol Felbamate Grapefruit juice Hydrocortisone Medications for anxiety or sleeping problems, such as diazepam or temazepam Medications for diabetes, including pioglitazone Mineral oil Modafinil Mycophenolate Nefazodone Oxcarbazepine Phenytoin Prednisolone Raloxifene Ritonavir or other medications for HIV infection or AIDS Rosuvastatin Selegiline Soy isoflavones supplements St. John's wort Tamoxifen Theophylline Thyroid hormones Topiramate Warfarin This list may not describe all possible interactions. Give your health care provider a list of all the medicines, herbs, non-prescription drugs, or dietary supplements  you use. Also tell them if you smoke, drink alcohol, or use illegal drugs. Some items may interact with your medicine. What should I watch for while using this medication? Visit your health care provider for regular checks on your progress. You will need a regular breast and pelvic exam and Pap smear while on this medication. You should also discuss the need for regular mammograms with your health care provider, and follow his or her guidelines for these tests. This medication can make your body retain fluid, making your fingers, hands, or ankles swell. Your blood pressure can go up. Contact your health care provider if you feel you are retaining fluid. Use an additional method of contraception during the first cycle that you take these tablets. If you have any reason to think you are pregnant, stop taking this medication right away and contact your health care provider. If you are taking this medication for hormone related problems, it may take several cycles to see improvement in your condition. Do not use this product if you smoke and are over 5 years of age. Smoking increases the risk of getting a blood clot or having a stroke while you are taking birth control pills, especially if you are more than 21 years old. If you are a smoker who is 61 years of age or younger, you are strongly advised not to smoke while taking birth control pills. This medication can make you more sensitive to the sun. Keep out of the sun. If you cannot avoid being in the sun, wear protective clothing and use sunscreen. Do not use sun lamps or tanning beds/booths. If you wear contact lenses and notice visual changes, or if the lenses begin to feel uncomfortable, consult your eye care specialist. In some women, tenderness, swelling, or minor bleeding of the gums may occur. Notify your dentist if this  happens. Brushing and flossing your teeth regularly may help limit this. See your dentist regularly and inform your dentist of the  medications you are taking. If you are going to have elective surgery, you may need to stop taking this medication before the surgery. Consult your health care provider for advice. This medication does not protect you against HIV infection (AIDS) or any other sexually transmitted diseases. What side effects may I notice from receiving this medication? Side effects that you should report to your care team as soon as possible: Allergic reactions--skin rash, itching, hives, swelling of the face, lips, tongue, or throat Blood clot--pain, swelling, or warmth in the leg, shortness of breath, chest pain Gallbladder problems--severe stomach pain, nausea, vomiting, fever Increase in blood pressure Liver injury--right upper belly pain, loss of appetite, nausea, light-colored stool, dark yellow or brown urine, yellowing skin or eyes, unusual weakness, fatigue New or worsening migraines or headaches Stroke--sudden numbness or weakness of the face, arm, or leg, trouble speaking, confusion, trouble walking, loss of balance or coordination, dizziness, severe headache, change in vision Unusual vaginal discharge, itching, or odor Worsening mood, feelings of depression Side effects that usually do not require medical attention (report these to your care team if they continue or are bothersome): Breast pain or tenderness Dark patches of skin on the face or other sun-exposed areas Irregular menstrual cycles or spotting Nausea Weight gain This list may not describe all possible side effects. Call your doctor for medical advice about side effects. You may report side effects to FDA at 1-800-FDA-1088. Where should I keep my medication? Keep out of the reach of children and pets. Store at room temperature between 15 and 30 degrees C (59 and 86 degrees F). Get rid of any unused medication after the expiration date. To get rid of medications that are no longer needed or have expired: Take the medication to a  medication take-back program. Check with your pharmacy or law enforcement to find a location. If you cannot return the medication, check the label or package insert to see if the medication should be thrown out in the garbage or flushed down the toilet. If you are not sure, ask your care team. If it is safe to put it in the trash, empty the medication out of the container. Mix the medication with cat litter, dirt, coffee grounds, or other unwanted substance. Seal the mixture in a bag or container. Put it in the trash. NOTE: This sheet is a summary. It may not cover all possible information. If you have questions about this medicine, talk to your doctor, pharmacist, or health care provider.  2023 Elsevier/Gold Standard (2020-10-17 00:00:00)    If you have been instructed to have an in-person evaluation today at a local Urgent Care facility, please use the link below. It will take you to a list of all of our available [facility], including address, phone number and hours of operation. Please do not delay care.  [facility]  If you or a family member do not have a primary care provider, use the link below to schedule a visit and establish care. When you choose a [facility] primary care physician or advanced practice provider, you gain a long-term partner in health. Find a Primary Care Provider  Learn more about [facility] in-office and virtual care options: [facility]

## 2022-07-16 NOTE — Progress Notes (Signed)
Virtual Visit Consent   Erika Medina, you are scheduled for a virtual visit with a [facility] provider today. Just as with appointments in the office, your consent must be obtained to participate. Your consent will be active for this visit and any virtual visit you may have with one of our providers in the next 365 days. If you have a MyChart account, a copy of this consent can be sent to you electronically.  As this is a virtual visit, video technology does not allow for your provider to perform a traditional examination. This may limit your provider's ability to fully assess your condition. If your provider identifies any concerns that need to be evaluated in person or the need to arrange testing (such as labs, EKG, etc.), we will make arrangements to do so. Although advances in technology are sophisticated, we cannot ensure that it will always work on either your end or our end. If the connection with a video visit is poor, the visit may have to be switched to a telephone visit. With either a video or telephone visit, we are not always able to ensure that we have a secure connection.  By engaging in this virtual visit, you consent to the provision of healthcare and authorize for your insurance to be billed (if applicable) for the services provided during this visit. Depending on your insurance coverage, you may receive a charge related to this service.  I need to obtain your verbal consent now. Are you willing to proceed with your visit today? Diannia Hogenson has provided verbal consent on 07/16/2022 for a virtual visit (video or telephone). Mar Daring, PA-C  Date: 07/16/2022 4:31 PM  Virtual Visit via Video Note   I, Mar Daring, connected with  Erika Medina  ([id number], [birth year]) on 07/16/22 at  4:15 PM EDT by a video-enabled telemedicine application and verified that I am speaking with the correct person using two identifiers.  Location: Patient: Virtual Visit Location  Patient: Home Provider: Virtual Visit Location Provider: Home Office   I discussed the limitations of evaluation and management by telemedicine and the availability of in person appointments. The patient expressed understanding and agreed to proceed.    History of Present Illness: Erika Medina is a 21 y.o. who identifies as a female who was assigned female at birth, and is being seen today for refill of her OCP. She reports she has not had any true lapse in coverage as she just ran out 3 days ago. She reports she was given a one month emergency refill, but her previous prescriber has left and she has not been able to establish with someone new. She is currently on Estarylla and is tolerating well.    Problems: There are no problems to display for this patient.   Allergies: Not on File Medications:  Current Outpatient Medications:    norgestimate-ethinyl estradiol (ESTARYLLA) 0.25-35 MG-MCG tablet, Take 1 tablet by mouth daily., Disp: 84 tablet, Rfl: 0   cetirizine (ZYRTEC ALLERGY) 10 MG tablet, Take 1 tablet (10 mg total) by mouth daily., Disp: 10 tablet, Rfl: 0   ciprofloxacin-dexamethasone (CIPRODEX) OTIC suspension, Place 4 drops into the right ear 2 (two) times daily., Disp: 7.5 mL, Rfl: 0   diphenhydrAMINE (BENADRYL) 25 MG tablet, Take 1 tablet (25 mg total) by mouth at bedtime as needed., Disp: 10 tablet, Rfl: 0  Observations/Objective: Patient is well-developed, well-nourished in no acute distress.  Resting comfortably at home.  Head is normocephalic, atraumatic.  No labored breathing.  Speech is clear and coherent with logical content.  Patient is alert and oriented at baseline.    Assessment and Plan: 1. Encounter for repeat prescription of oral contraceptives - norgestimate-ethinyl estradiol (ESTARYLLA) 0.25-35 MG-MCG tablet; Take 1 tablet by mouth daily.  Dispense: 84 tablet; Refill: 0  - Medication refilled on emergency basis for 3 months to cover until she can  re-establish with a new provider for future, long-term refills; she voices understanding to establish with a new provider in the meantime  Follow Up Instructions: I discussed the assessment and treatment plan with the patient. The patient was provided an opportunity to ask questions and all were answered. The patient agreed with the plan and demonstrated an understanding of the instructions.  A copy of instructions were sent to the patient via MyChart unless otherwise noted below.    The patient was advised to call back or seek an in-person evaluation if the symptoms worsen or if the condition fails to improve as anticipated.  Time:  I spent 8 minutes with the patient via telehealth technology discussing the above problems/concerns.    Mar Daring, PA-C

## 2022-07-20 ENCOUNTER — Encounter (INDEPENDENT_AMBULATORY_CARE_PROVIDER_SITE_OTHER): Payer: Self-pay | Admitting: Family

## 2022-07-20 ENCOUNTER — Telehealth (INDEPENDENT_AMBULATORY_CARE_PROVIDER_SITE_OTHER): Payer: TRICARE Prime—HMO | Admitting: Family

## 2022-07-20 DIAGNOSIS — Z3009 Encounter for other general counseling and advice on contraception: Secondary | ICD-10-CM

## 2022-07-20 DIAGNOSIS — R000 Tachycardia, unspecified: Secondary | ICD-10-CM

## 2022-07-20 DIAGNOSIS — R0602 Shortness of breath: Secondary | ICD-10-CM

## 2022-07-20 MED ORDER — NORGESTIMATE-ETH ESTRADIOL 0.25-35 MG-MCG PO TABS
1.0000 | ORAL_TABLET | Freq: Every day | ORAL | 3 refills | Status: DC
Start: 2022-07-20 — End: 2023-09-05
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
## 2022-08-21 ENCOUNTER — Other Ambulatory Visit: Payer: Self-pay

## 2022-08-21 ENCOUNTER — Encounter (HOSPITAL_COMMUNITY): Payer: Self-pay | Admitting: Emergency Medicine

## 2022-08-21 ENCOUNTER — Emergency Department (HOSPITAL_COMMUNITY)
Admission: EM | Admit: 2022-08-21 | Discharge: 2022-08-22 | Disposition: A | Discharge status: Home / Self Care | Attending: Emergency Medicine | Admitting: Emergency Medicine

## 2022-08-21 DIAGNOSIS — R197 Diarrhea, unspecified: Secondary | ICD-10-CM | POA: Diagnosis not present

## 2022-08-21 DIAGNOSIS — R112 Nausea with vomiting, unspecified: Secondary | ICD-10-CM | POA: Diagnosis present

## 2022-08-21 LAB — LIPASE, BLOOD: Lipase: 28 U/L (ref 11–51)

## 2022-08-21 LAB — CBC WITH DIFFERENTIAL/PLATELET
Abs Immature Granulocytes: 0.02 10*3/uL (ref 0.00–0.07)
Basophils Absolute: 0 10*3/uL (ref 0.0–0.1)
Basophils Relative: 0 %
Eosinophils Absolute: 0 10*3/uL (ref 0.0–0.5)
Eosinophils Relative: 0 %
HCT: 35 % — ABNORMAL LOW (ref 36.0–46.0)
Hemoglobin: 11.9 g/dL — ABNORMAL LOW (ref 12.0–15.0)
Immature Granulocytes: 0 %
Lymphocytes Relative: 27 %
Lymphs Abs: 2.1 10*3/uL (ref 0.7–4.0)
MCH: 29.9 pg (ref 26.0–34.0)
MCHC: 34 g/dL (ref 30.0–36.0)
MCV: 87.9 fL (ref 80.0–100.0)
Monocytes Absolute: 0.3 10*3/uL (ref 0.1–1.0)
Monocytes Relative: 4 %
Neutro Abs: 5.1 10*3/uL (ref 1.7–7.7)
Neutrophils Relative %: 69 %
Platelets: 249 10*3/uL (ref 150–400)
RBC: 3.98 MIL/uL (ref 3.87–5.11)
RDW: 12.6 % (ref 11.5–15.5)
WBC: 7.6 10*3/uL (ref 4.0–10.5)
nRBC: 0 % (ref 0.0–0.2)

## 2022-08-21 LAB — URINALYSIS, ROUTINE W REFLEX MICROSCOPIC
Bacteria, UA: NONE SEEN
Bilirubin Urine: NEGATIVE
Glucose, UA: NEGATIVE mg/dL
Ketones, ur: 80 mg/dL — AB
Leukocytes,Ua: NEGATIVE
Nitrite: NEGATIVE
Protein, ur: NEGATIVE mg/dL
Specific Gravity, Urine: 1.019 (ref 1.005–1.030)
pH: 5 (ref 5.0–8.0)

## 2022-08-21 LAB — COMPREHENSIVE METABOLIC PANEL
ALT: 11 U/L (ref 0–44)
AST: 17 U/L (ref 15–41)
Albumin: 4.2 g/dL (ref 3.5–5.0)
Alkaline Phosphatase: 44 U/L (ref 38–126)
Anion gap: 11 (ref 5–15)
BUN: 8 mg/dL (ref 6–20)
CO2: 20 mmol/L — ABNORMAL LOW (ref 22–32)
Calcium: 9.3 mg/dL (ref 8.9–10.3)
Chloride: 108 mmol/L (ref 98–111)
Creatinine, Ser: 0.72 mg/dL (ref 0.44–1.00)
GFR, Estimated: >60 mL/min (ref >60)
Glucose, Bld: 74 mg/dL (ref 70–99)
Potassium: 3.8 mmol/L (ref 3.5–5.1)
Sodium: 139 mmol/L (ref 135–145)
Total Bilirubin: 1 mg/dL (ref 0.3–1.2)
Total Protein: 7.5 g/dL (ref 6.5–8.1)

## 2022-08-21 LAB — PREGNANCY, URINE: Preg Test, Ur: NEGATIVE

## 2022-08-21 MED ORDER — ONDANSETRON 4 MG PO TBDP
4.0000 mg | ORAL_TABLET | Freq: Once | ORAL | Status: AC
  Administered 2022-08-21: 4 mg via ORAL
  Filled 2022-08-21: qty 1

## 2022-08-21 NOTE — ED Provider Triage Note (Signed)
Emergency Medicine Provider Triage Evaluation Note  Erika Medina , a 21 y.o. female  was evaluated in triage.  Pt complains of abdominal pain that started around 3 PM today after having some expired bread.  She described the pain as dull, constant, located on the right lower quadrant.  Reports nausea with 3 episodes of vomiting.  Denies any chest pain, shortness of breath, fever, bowel changes, urinary symptoms.  Review of Systems  Positive: As above Negative: As above  Physical Exam  BP (!) 140/101   Pulse 97   Temp 98.4 F (36.9 C)   Resp 18   Ht 5\' 2"  (1.575 m)   Wt 68 kg   LMP 08/19/2022   SpO2 100%   BMI 27.44 kg/m  Gen:   Awake, no distress   Resp:  Normal effort  MSK:   Moves extremities without difficulty  Other:  TTP to right lower quadrant.  Medical Decision Making  Medically screening exam initiated at 8:23 PM.  Appropriate orders placed.  Erika Medina was informed that the remainder of the evaluation will be completed by another provider, this initial triage assessment does not replace that evaluation, and the importance of remaining in the ED until their evaluation is complete.     Sonnie Alamo, Jeanelle Malling 08/21/22 2302

## 2022-08-21 NOTE — ED Triage Notes (Signed)
Pt c/o N/V/D since 1500 today. States that she ate some expired bead around noon

## 2022-08-22 MED ORDER — ONDANSETRON HCL 4 MG PO TABS: 4.0000 mg | ORAL_TABLET | Freq: Four times a day (QID) | ORAL | 0 refills | Status: DC

## 2022-08-22 NOTE — Discharge Instructions (Signed)
Likely you have gastritis, given you Zofran please use as needed for nausea.  I recommend a bland diet.  You may advance as you are feeling better.  Follow-up with your PCP as needed  Please come back if you have worsening symptoms, severe abdominal pain uncontrolled nausea or vomiting fevers chills.

## 2022-08-22 NOTE — ED Provider Notes (Signed)
[facility] DEPT Provider Note   CSN: [id number] Arrival date & time: 08/21/22  1946     History  Chief Complaint  Patient presents with   Emesis    Erika Medina is a 21 y.o. female.  HPI   Patient without significant medical history presents complaints of nausea vomiting, started today, states it happened after she ate some expired bread, states she had 3 episodes of emesis, no coffee grounds no hematemesis, also notes she has had some diarrhea, this was today, no bloody stools, no melena, no abdominal surgeries, denies excessive alcohol use or NSAID use, no history of stomach ulcers, no fevers no chills, not endorsing any urinary symptoms no vaginal discharge discharge no vaginal bleeding she has no other complaints she states that all of her symptoms have resolved.    Home Medications Prior to Admission medications   Medication Sig Start Date End Date Taking? Authorizing Provider  diphenhydrAMINE (BENADRYL) 25 MG tablet Take 1 tablet (25 mg total) by mouth at bedtime as needed. 07/19/21  Yes Fondaw, Kathleene Hazel, PA  norgestimate-ethinyl estradiol (ESTARYLLA) 0.25-35 MG-MCG tablet Take 1 tablet by mouth daily. 07/16/22  Yes Mar Daring, PA-C  ondansetron (ZOFRAN) 4 MG tablet Take 1 tablet (4 mg total) by mouth every 6 (six) hours. 08/22/22  Yes Marcello Fennel, PA-C  cetirizine (ZYRTEC ALLERGY) 10 MG tablet Take 1 tablet (10 mg total) by mouth daily. Patient not taking: Reported on 08/21/2022 07/19/21   Tedd Sias, PA  ciprofloxacin-dexamethasone (CIPRODEX) OTIC suspension Place 4 drops into the right ear 2 (two) times daily. Patient not taking: Reported on 08/21/2022 07/19/21   Tedd Sias, PA      Allergies    Patient has no known allergies.    Review of Systems   Review of Systems  Constitutional:  Negative for chills and fever.  Respiratory:  Negative for shortness of breath.   Cardiovascular:  Negative for chest  pain.  Gastrointestinal:  Negative for abdominal pain.  Neurological:  Negative for headaches.    Physical Exam Updated Vital Signs BP (!) 144/105   Pulse 93   Temp 98.4 F (36.9 C)   Resp 17   Ht 5' 2" (1.575 m)   Wt 68 kg   LMP 08/19/2022   SpO2 99%   BMI 27.44 kg/m  Physical Exam Vitals and nursing note reviewed.  Constitutional:      General: She is not in acute distress.    Appearance: She is not ill-appearing.  HENT:     Head: Normocephalic and atraumatic.     Nose: No congestion.  Eyes:     Conjunctiva/sclera: Conjunctivae normal.  Cardiovascular:     Rate and Rhythm: Normal rate and regular rhythm.     Pulses: Normal pulses.     Heart sounds: No murmur heard.    No friction rub. No gallop.  Pulmonary:     Effort: No respiratory distress.     Breath sounds: No wheezing, rhonchi or rales.  Abdominal:     Palpations: Abdomen is soft.     Tenderness: There is no abdominal tenderness. There is no right CVA tenderness or left CVA tenderness.     Comments: Abdomen is soft nontender no guarding rebound or peritoneal sign negative Murphy sign McBurney point no CVA tenderness.  Skin:    General: Skin is warm and dry.  Neurological:     Mental Status: She is alert.  Psychiatric:  Mood and Affect: Mood normal.     ED Results / Procedures / Treatments   Labs (all labs ordered are listed, but only abnormal results are displayed) Labs Reviewed  CBC WITH DIFFERENTIAL/PLATELET - Abnormal; Notable for the following components:      Result Value   Hemoglobin 11.9 (*)    HCT 35.0 (*)    All other components within normal limits  COMPREHENSIVE METABOLIC PANEL - Abnormal; Notable for the following components:   CO2 20 (*)    All other components within normal limits  URINALYSIS, ROUTINE W REFLEX MICROSCOPIC - Abnormal; Notable for the following components:   Hgb urine dipstick MODERATE (*)    Ketones, ur 80 (*)    All other components within normal limits   LIPASE, BLOOD  PREGNANCY, URINE    EKG None  Radiology No results found.  Procedures Procedures    Medications Ordered in ED Medications  ondansetron (ZOFRAN-ODT) disintegrating tablet 4 mg (4 mg Oral Given 08/21/22 2039)    ED Course/ Medical Decision Making/ A&P                           Medical Decision Making  This patient presents to the ED for concern of nausea vomiting diarrhea, this involves an extensive number of treatment options, and is a complaint that carries with it a high risk of complications and morbidity.  The differential diagnosis includes diverticulitis, bowel obstruction, volvulus,    Additional history obtained:  Additional history obtained from N/A External records from outside source obtained and reviewed including urgent care notes   Co morbidities that complicate the patient evaluation  N/A  Social Determinants of Health:  No primary care provider    Lab Tests:  I Ordered, and personally interpreted labs.  The pertinent results include: CBC shows normocytic anemia hemoglobin 11.9, CMP shows CO2 of 20, UA shows ketones, lipase is 28 urine pregnancy negative   Imaging Studies ordered:  I ordered imaging studies including N/A I independently visualized and interpreted imaging which showed N/A I agree with the radiologist interpretation   Cardiac Monitoring:  The patient was maintained on a cardiac monitor.  I personally viewed and interpreted the cardiac monitored which showed an underlying rhythm of: N/A   Medicines ordered and prescription drug management:  I ordered medication including Zofran I have reviewed the patients home medicines and have made adjustments as needed  Critical Interventions:  N/A   Reevaluation:  Triage obtain lab work and provide with Zofran, lab work was unremarkable, patient states she is having no pain at this time, she had a benign physical exam, agreement with discharge at this  time.   Consultations Obtained:  N/a    Test Considered:  CT AP-deferred she is nonsurgical abdomen she has no pain at this time    Rule out low suspicion for lower lobe pneumonia as lung sounds are clear bilaterally, will defer imaging at this time.  I have low suspicion for liver or gallbladder abnormality as she has no right upper quadrant tenderness, liver enzymes, alk phos, T bili all within normal limits.  Low suspicion for pancreatitis as lipase is within normal limits.  Low suspicion for ruptured stomach ulcer as she has no peritoneal sign present on exam.  Low suspicion for bowel obstruction as abdomen is nondistended normal bowel sounds,  passing gas and having normal bowel movements.  Low suspicion for complicated diverticulitis as she is nontoxic-appearing, vital signs reassuring   no leukocytosis present.  Low suspicion for appendicitis as she has no right lower quadrant tenderness, vital signs reassuring.  I have low suspicion for intra-abdominal infection as she has low risk factors, vital signs reassuring, no leukocytosis, will defer imaging at this time.     Dispostion and problem list  After consideration of the diagnostic results and the patients response to treatment, I feel that the patent would benefit from discharge.  Nausea vomiting diarrhea-suspect gastritis, will discharge home with Zofran, recommend a bland diet advised strict return precautions.            Final Clinical Impression(s) / ED Diagnoses Final diagnoses:  Nausea vomiting and diarrhea    Rx / DC Orders ED Discharge Orders          Ordered    ondansetron (ZOFRAN) 4 MG tablet  Every 6 hours        08/22/22 0020              Marcello Fennel, PA-C 08/22/22 Zackery Barefoot, MD 08/22/22 [phone]

## 2022-11-21 ENCOUNTER — Ambulatory Visit (HOSPITAL_COMMUNITY)
Admission: RE | Admit: 2022-11-21 | Discharge: 2022-11-21 | Disposition: A | Discharge status: Home / Self Care | Source: Ambulatory Visit | Attending: Internal Medicine | Admitting: Internal Medicine

## 2022-11-21 ENCOUNTER — Encounter (HOSPITAL_COMMUNITY): Payer: Self-pay

## 2022-11-21 ENCOUNTER — Other Ambulatory Visit: Payer: Self-pay

## 2022-11-21 VITALS — BP 129/89 | HR 99 | Temp 100.5°F | Resp 18

## 2022-11-21 DIAGNOSIS — Z1152 Encounter for screening for COVID-19: Secondary | ICD-10-CM | POA: Diagnosis not present

## 2022-11-21 DIAGNOSIS — J069 Acute upper respiratory infection, unspecified: Secondary | ICD-10-CM | POA: Diagnosis present

## 2022-11-21 MED ORDER — BENZONATATE 100 MG PO CAPS: 100.0000 mg | ORAL_CAPSULE | Freq: Three times a day (TID) | ORAL | 0 refills | Status: DC

## 2022-11-21 MED ORDER — IBUPROFEN 800 MG PO TABS
800.0000 mg | ORAL_TABLET | Freq: Once | ORAL | Status: AC
  Administered 2022-11-21: 800 mg via ORAL

## 2022-11-21 MED ORDER — IBUPROFEN 800 MG PO TABS
ORAL_TABLET | ORAL | Status: AC
  Filled 2022-11-21: qty 1

## 2022-11-21 MED ORDER — PROMETHAZINE-DM 6.25-15 MG/5ML PO SYRP: 5.0000 mL | ORAL_SOLUTION | Freq: Every evening | ORAL | 0 refills | Status: DC | PRN

## 2022-11-21 NOTE — ED Triage Notes (Signed)
Pt reports since Thursday she has had a cough ,sore throat,congestion ,nausea and body aches.

## 2022-11-21 NOTE — Discharge Instructions (Addendum)
You have a viral upper respiratory infection.  COVID-19 testing is pending. We will call you with results if positive. If your COVID test is positive, you must stay at home until day 6 of symptoms. On day 6, you may go out into public and go back to work, but you must wear a mask until day 11 of symptoms to prevent spread to others.  Use the following medicines to help with symptoms: - Plain Mucinex (guaifenesin) over the counter as directed every 12 hours to thin mucous so that you are able to get it out of your body easier. Drink plenty of water while taking this medication so that it works well in your body (at least 8 cups a day).  - Tylenol 1,017m and/or ibuprofen [id number] 6 hours with food as needed for aches/pains or fever/chills.  - Tessalon perles every 8 hours as needed for cough. - Take Promethazine DM cough medication to help with your cough at nighttime so that you are able to sleep. Do not drive, drink alcohol, or go to work while taking this medication since it can make you sleepy. Only take this at nighttime.   1 tablespoon of honey in warm water and/or salt water gargles may also help with symptoms. Humidifier to your room will help add water to the air and reduce coughing.  If you develop any new or worsening symptoms, please return.  If your symptoms are severe, please go to the emergency room.  Follow-up with your primary care provider for further evaluation and management of your symptoms as well as ongoing wellness visits.  I hope you feel better!

## 2022-11-22 LAB — SARS CORONAVIRUS 2 (TAT 6-24 HRS): SARS Coronavirus 2: NEGATIVE

## 2022-11-25 NOTE — ED Provider Notes (Signed)
[facility]    CSN: [id number] Arrival date & time: 11/21/22  1049      History   Chief Complaint Chief Complaint  Patient presents with   Influenza    I've been having symptoms since Thursday of aches, sore throat, cough, chills, fatigue and nausea - Entered by patient   Generalized Body Aches   Fever   Sore Throat   Cough   Nasal Congestion   Nausea    HPI Erika Medina is a 22 y.o. female.   Patient presents to urgent care for evaluation of sore throat, cough, body aches, fever/chills, and generalized fatigue for the last 4 days since Thursday November 18, 2022. No known sick contacts with similar symptoms. No history of asthma or other chronic respiratory problems. She is not a smoker and denies drug use. Cough is productive and worse at night. No recent antibiotic or steroid use. Denies nausea, vomiting, diarrhea, abdominal pain, shortness of breath, chest pain, heart palpitations, and headache. Denies chance of pregnancy, taking OTC medicines without relief.    Influenza Presenting symptoms: cough and fever   Fever Associated symptoms: cough   Sore Throat  Cough Associated symptoms: fever     History reviewed. No pertinent past medical history.  There are no problems to display for this patient.   History reviewed. No pertinent surgical history.  OB History   No obstetric history on file.      Home Medications    Prior to Admission medications   Medication Sig Start Date End Date Taking? Authorizing Provider  benzonatate (TESSALON) 100 MG capsule Take 1 capsule (100 mg total) by mouth every 8 (eight) hours. 11/21/22  Yes Talbot Grumbling, FNP  promethazine-dextromethorphan (PROMETHAZINE-DM) 6.25-15 MG/5ML syrup Take 5 mLs by mouth at bedtime as needed for cough. 11/21/22  Yes Talbot Grumbling, FNP  cetirizine (ZYRTEC ALLERGY) 10 MG tablet Take 1 tablet (10 mg total) by mouth daily. Patient not taking: Reported on 08/21/2022 07/19/21    Tedd Sias, PA  ciprofloxacin-dexamethasone (CIPRODEX) OTIC suspension Place 4 drops into the right ear 2 (two) times daily. Patient not taking: Reported on 08/21/2022 07/19/21   Tedd Sias, PA  diphenhydrAMINE (BENADRYL) 25 MG tablet Take 1 tablet (25 mg total) by mouth at bedtime as needed. 07/19/21   Tedd Sias, PA  norgestimate-ethinyl estradiol (ESTARYLLA) 0.25-35 MG-MCG tablet Take 1 tablet by mouth daily. 07/16/22   Mar Daring, PA-C  ondansetron (ZOFRAN) 4 MG tablet Take 1 tablet (4 mg total) by mouth every 6 (six) hours. 08/22/22   Marcello Fennel, PA-C    Family History History reviewed. No pertinent family history.  Social History Social History   Tobacco Use   Smoking status: Never   Smokeless tobacco: Never  Substance Use Topics   Alcohol use: Not Currently   Drug use: Not Currently     Allergies   Patient has no known allergies.   Review of Systems Review of Systems  Constitutional:  Positive for fever.  Respiratory:  Positive for cough.   Per HPI   Physical Exam Triage Vital Signs ED Triage Vitals  Enc Vitals Group     BP 11/21/22 1119 129/89     Pulse Rate 11/21/22 1119 99     Resp 11/21/22 1119 18     Temp 11/21/22 1119 (!) 100.5 F (38.1 C)     Temp src --      SpO2 11/21/22 1119 97 %  Weight --      Height --      Head Circumference --      Peak Flow --      Pain Score 11/21/22 1117 6     Pain Loc --      Pain Edu? --      Excl. in [place]? --    No data found.  Updated Vital Signs BP 129/89   Pulse 99   Temp (!) 100.5 F (38.1 C)   Resp 18   LMP 11/13/2022   SpO2 97%   Visual Acuity Right Eye Distance:   Left Eye Distance:   Bilateral Distance:    Right Eye Near:   Left Eye Near:    Bilateral Near:     Physical Exam Vitals and nursing note reviewed.  Constitutional:      Appearance: She is not ill-appearing or toxic-appearing.  HENT:     Head: Normocephalic and atraumatic.     Right Ear:  Hearing, tympanic membrane, ear canal and external ear normal.     Left Ear: Hearing, tympanic membrane, ear canal and external ear normal.     Nose: Congestion present.     Mouth/Throat:     Lips: Pink.     Mouth: Mucous membranes are moist. No injury.     Tongue: No lesions. Tongue does not deviate from midline.     Palate: No mass and lesions.     Pharynx: Oropharynx is clear. Uvula midline. No pharyngeal swelling, oropharyngeal exudate, posterior oropharyngeal erythema or uvula swelling.     Tonsils: No tonsillar exudate or tonsillar abscesses.  Eyes:     General: Lids are normal. Vision grossly intact. Gaze aligned appropriately.     Extraocular Movements: Extraocular movements intact.     Conjunctiva/sclera: Conjunctivae normal.  Cardiovascular:     Rate and Rhythm: Normal rate and regular rhythm.     Heart sounds: Normal heart sounds, S1 normal and S2 normal.  Pulmonary:     Effort: Pulmonary effort is normal. No respiratory distress.     Breath sounds: Normal breath sounds and air entry.  Musculoskeletal:     Cervical back: Neck supple.  Skin:    General: Skin is warm and dry.     Capillary Refill: Capillary refill takes less than 2 seconds.     Findings: No rash.  Neurological:     General: No focal deficit present.     Mental Status: She is alert and oriented to person, place, and time. Mental status is at baseline.     Cranial Nerves: No dysarthria or facial asymmetry.  Psychiatric:        Mood and Affect: Mood normal.        Speech: Speech normal.        Behavior: Behavior normal.        Thought Content: Thought content normal.        Judgment: Judgment normal.      UC Treatments / Results  Labs (all labs ordered are listed, but only abnormal results are displayed) Labs Reviewed  SARS CORONAVIRUS 2 (TAT 6-24 HRS)    EKG   Radiology No results found.  Procedures Procedures (including critical care time)  Medications Ordered in UC Medications   ibuprofen (ADVIL) tablet 800 mg (800 mg Oral Given 11/21/22 1152)    Initial Impression / Assessment and Plan / UC Course  I have reviewed the triage vital signs and the nursing notes.  Pertinent labs & imaging results that  were available during my care of the patient were reviewed by me and considered in my medical decision making (see chart for details).   1. Viral URI with cough Symptoms and physical exam consistent with a viral upper respiratory tract infection that will likely resolve with rest, fluids, and prescriptions for symptomatic relief. Deferred imaging based on stable cardiopulmonary exam and hemodynamically stable vital signs. COVID-19 testing is pending.  We will call patient if this is positive.  Quarantine guidelines discussed.   Patient given ibuprofen in clinic today for fever.   Tessalon perles and promethazine DM sent to pharmacy for symptomatic relief to be taken as prescribed.  May continue taking over the counter medications as directed for further symptomatic relief.  Drowsiness precautions discussed regarding promethazine DM prescription.  Nonpharmacologic interventions for symptom relief provided and after visit summary below. Advised to push fluids to stay well hydrated while recovering from viral illness.   Discussed physical exam and available lab work findings in clinic with patient.  Counseled patient regarding appropriate use of medications and potential side effects for all medications recommended or prescribed today. Discussed red flag signs and symptoms of worsening condition,when to call the PCP office, return to urgent care, and when to seek higher level of care in the emergency department. Patient verbalizes understanding and agreement with plan. All questions answered. Patient discharged in stable condition.    Final Clinical Impressions(s) / UC Diagnoses   Final diagnoses:  Viral URI with cough     Discharge Instructions      You have a viral  upper respiratory infection.  COVID-19 testing is pending. We will call you with results if positive. If your COVID test is positive, you must stay at home until day 6 of symptoms. On day 6, you may go out into public and go back to work, but you must wear a mask until day 11 of symptoms to prevent spread to others.  Use the following medicines to help with symptoms: - Plain Mucinex (guaifenesin) over the counter as directed every 12 hours to thin mucous so that you are able to get it out of your body easier. Drink plenty of water while taking this medication so that it works well in your body (at least 8 cups a day).  - Tylenol 1,'000mg'$  and/or ibuprofen '600mg'$  every 6 hours with food as needed for aches/pains or fever/chills.  - Tessalon perles every 8 hours as needed for cough. - Take Promethazine DM cough medication to help with your cough at nighttime so that you are able to sleep. Do not drive, drink alcohol, or go to work while taking this medication since it can make you sleepy. Only take this at nighttime.   1 tablespoon of honey in warm water and/or salt water gargles may also help with symptoms. Humidifier to your room will help add water to the air and reduce coughing.  If you develop any new or worsening symptoms, please return.  If your symptoms are severe, please go to the emergency room.  Follow-up with your primary care provider for further evaluation and management of your symptoms as well as ongoing wellness visits.  I hope you feel better!   ED Prescriptions     Medication Sig Dispense Auth. Provider   benzonatate (TESSALON) 100 MG capsule Take 1 capsule (100 mg total) by mouth every 8 (eight) hours. 21 capsule Talbot Grumbling, FNP   promethazine-dextromethorphan (PROMETHAZINE-DM) 6.25-15 MG/5ML syrup Take 5 mLs by mouth at bedtime as needed  for cough. 118 mL Talbot Grumbling, FNP      PDMP not reviewed this encounter.   Talbot Grumbling, [place] 11/26/22  1616

## 2023-01-20 ENCOUNTER — Encounter (HOSPITAL_COMMUNITY): Payer: Self-pay

## 2023-01-20 ENCOUNTER — Ambulatory Visit (HOSPITAL_COMMUNITY)
Admission: RE | Admit: 2023-01-20 | Discharge: 2023-01-20 | Disposition: A | Discharge status: Home / Self Care | Source: Ambulatory Visit | Attending: Internal Medicine | Admitting: Internal Medicine

## 2023-01-20 VITALS — BP 131/94 | HR 84 | Temp 98.3°F | Resp 16

## 2023-01-20 DIAGNOSIS — R110 Nausea: Secondary | ICD-10-CM | POA: Diagnosis not present

## 2023-01-20 DIAGNOSIS — J069 Acute upper respiratory infection, unspecified: Secondary | ICD-10-CM | POA: Diagnosis not present

## 2023-01-20 MED ORDER — ONDANSETRON 4 MG PO TBDP: 4.0000 mg | ORAL_TABLET | Freq: Three times a day (TID) | ORAL | 0 refills | Status: DC | PRN

## 2023-01-20 MED ORDER — ONDANSETRON 4 MG PO TBDP
4.0000 mg | ORAL_TABLET | Freq: Once | ORAL | Status: AC
  Administered 2023-01-20: 4 mg via ORAL

## 2023-01-20 MED ORDER — ONDANSETRON 4 MG PO TBDP
ORAL_TABLET | ORAL | Status: AC
  Filled 2023-01-20: qty 1

## 2023-01-20 MED ORDER — BENZONATATE 100 MG PO CAPS: 100.0000 mg | ORAL_CAPSULE | Freq: Three times a day (TID) | ORAL | 0 refills | Status: DC

## 2023-01-20 NOTE — ED Triage Notes (Signed)
Patient c/o sore throat, nasal congestion, and a non productive cough x 4 days.  Patient states she took Tylenol, Benadryl yesterday, but none today.

## 2023-01-20 NOTE — Discharge Instructions (Addendum)
Your symptoms are most likely due to a viral illness, which will improve on its own with rest and fluids.  We will treat your symptoms with the following: - Zofran 4mg every 8 hours as needed to help with nausea and vomiting.  Eat a bland diet for the next 12-24 hours (bananas, rice, white toast, and applesauce) once you are able to tolerate liquids (broth, etc). These foods are easy for your stomach to digest. Pedialyte can be purchased to help with rehydration. Drink plenty of water.  - Ibuprofen 600mg and/or tylenol 1,000mg every 6 hours as needed for fever/chills and aches/pains. - Guaifenesin (Mucinex) may be purchased over the counter to be taken as directed to reduce nasal congestion/cough - Tessalon perles every 8 hours as needed for coughing  - Two teaspoons of honey (10 ml) can be given by mouth or in warm water every four hours as needed and may decrease the amount of coughing. - Use a cool mist humidifier in your room at night  If you develop any new or worsening symptoms or do not improve in the next 2 to 3 days, please return.  If your symptoms are severe, please go to the emergency room.  Follow-up with your primary care provider for further evaluation and management of your symptoms as well as ongoing wellness visits.  I hope you feel better!   

## 2023-01-20 NOTE — ED Provider Notes (Signed)
[facility]    CSN: [id number] Arrival date & time: 01/20/23  4098      History   Chief Complaint Chief Complaint  Patient presents with   Sore Throat    sore throat, cough, chest pain, chills, and nausea since saturday 04/13 - Entered by patient   Cough   Nasal Congestion    HPI Erika Medina is a 22 y.o. female.   Patient presents to urgent care for evaluation of productive cough, sore throat, nasal congestion, nausea without vomiting, and fever/chills that started 4 days ago.  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  She states she may have been exposed to a viral illness at the convention.  Highest fever at home was 102 a few days ago.  Fever responded well to use of Tylenol as needed.  No body aches, headache, shortness of breath, or weakness.  She does report intermittent central chest discomfort.  Not currently experiencing any chest discomfort.  No history of asthma or allergic rhinitis.  Non-smoker, denies drug use.  Currently nauseous.  Has been using over-the-counter medications without relief.  Denies chance of pregnancy.  Last menstrual cycle was January 09, 2023.   Sore Throat  Cough   History reviewed. No pertinent past medical history.  There are no problems to display for this patient.   History reviewed. No pertinent surgical history.  OB History   No obstetric history on file.      Home Medications    Prior to Admission medications   Medication Sig Start Date End Date Taking? Authorizing Provider  ondansetron (ZOFRAN-ODT) 4 MG disintegrating tablet Take 1 tablet (4 mg total) by mouth every 8 (eight) hours as needed for nausea or vomiting. 01/20/23  Yes Carlisle Beers, FNP  benzonatate (TESSALON) 100 MG capsule Take 1 capsule (100 mg total) by mouth every 8 (eight) hours. 01/20/23   Carlisle Beers, FNP  cetirizine (ZYRTEC ALLERGY) 10  MG tablet Take 1 tablet (10 mg total) by mouth daily. Patient not taking: Reported on 08/21/2022 07/19/21   Gailen Shelter, PA  ciprofloxacin-dexamethasone (CIPRODEX) OTIC suspension Place 4 drops into the right ear 2 (two) times daily. Patient not taking: Reported on 08/21/2022 07/19/21   Gailen Shelter, PA  diphenhydrAMINE (BENADRYL) 25 MG tablet Take 1 tablet (25 mg total) by mouth at bedtime as needed. 07/19/21   Gailen Shelter, PA  norgestimate-ethinyl estradiol (ESTARYLLA) 0.25-35 MG-MCG tablet Take 1 tablet by mouth daily. 07/16/22   Margaretann Loveless, PA-C  ondansetron (ZOFRAN) 4 MG tablet Take 1 tablet (4 mg total) by mouth every 6 (six) hours. 08/22/22   Carroll Sage, PA-C  promethazine-dextromethorphan (PROMETHAZINE-DM) 6.25-15 MG/5ML syrup Take 5 mLs by mouth at bedtime as needed for cough. 11/21/22   Carlisle Beers, FNP    Family History History reviewed. No pertinent family history.  Social History Social History   Tobacco Use   Smoking status: Never   Smokeless tobacco: Never  Vaping Use   Vaping Use: Never used  Substance Use Topics   Alcohol use: Never   Drug use: Never     Allergies   Patient has no known allergies.   Review of Systems Review of Systems  Respiratory:  Positive for cough.   Per HPI   Physical Exam Triage Vital Signs ED Triage Vitals  Enc Vitals Group     BP 01/20/23 1009 Marland Kitchen)  131/94     Pulse Rate 01/20/23 1009 84     Resp 01/20/23 1009 16     Temp 01/20/23 1009 98.3 F (36.8 C)     Temp Source 01/20/23 1009 Oral     SpO2 01/20/23 1009 98 %     Weight --      Height --      Head Circumference --      Peak Flow --      Pain Score 01/20/23 1011 6     Pain Loc --      Pain Edu? --      Excl. in [place]? --    No data found.  Updated Vital Signs BP (!) 131/94 (BP Location: Left Arm)   Pulse 84   Temp 98.3 F (36.8 C) (Oral)   Resp 16   LMP 01/09/2023 (Approximate)   SpO2 98%   Visual Acuity Right Eye  Distance:   Left Eye Distance:   Bilateral Distance:    Right Eye Near:   Left Eye Near:    Bilateral Near:     Physical Exam Vitals and nursing note reviewed.  Constitutional:      Appearance: She is not ill-appearing or toxic-appearing.  HENT:     Head: Normocephalic and atraumatic.     Right Ear: Hearing, tympanic membrane, ear canal and external ear normal.     Left Ear: Hearing, tympanic membrane, ear canal and external ear normal.     Nose: Congestion present.     Mouth/Throat:     Lips: Pink.     Mouth: Mucous membranes are moist. No injury.     Tongue: No lesions. Tongue does not deviate from midline.     Palate: No mass and lesions.     Pharynx: Oropharynx is clear. Uvula midline. No pharyngeal swelling, oropharyngeal exudate, posterior oropharyngeal erythema or uvula swelling.     Tonsils: No tonsillar exudate or tonsillar abscesses.  Eyes:     General: Lids are normal. Vision grossly intact. Gaze aligned appropriately.     Extraocular Movements: Extraocular movements intact.     Conjunctiva/sclera: Conjunctivae normal.  Cardiovascular:     Rate and Rhythm: Normal rate and regular rhythm.     Heart sounds: Normal heart sounds, S1 normal and S2 normal.  Pulmonary:     Effort: Pulmonary effort is normal. No respiratory distress.     Breath sounds: Normal breath sounds and air entry. No stridor. No wheezing, rhonchi or rales.  Chest:     Chest wall: No tenderness.  Musculoskeletal:     Cervical back: Neck supple.  Lymphadenopathy:     Cervical: No cervical adenopathy.  Skin:    General: Skin is warm and dry.     Capillary Refill: Capillary refill takes less than 2 seconds.     Findings: No rash.  Neurological:     General: No focal deficit present.     Mental Status: She is alert and oriented to person, place, and time. Mental status is at baseline.     Cranial Nerves: No dysarthria or facial asymmetry.  Psychiatric:        Mood and Affect: Mood normal.         Speech: Speech normal.        Behavior: Behavior normal.        Thought Content: Thought content normal.        Judgment: Judgment normal.      UC Treatments / Results  Labs (all labs  ordered are listed, but only abnormal results are displayed) Labs Reviewed - No data to display  EKG   Radiology No results found.  Procedures Procedures (including critical care time)  Medications Ordered in UC Medications  ondansetron (ZOFRAN-ODT) disintegrating tablet 4 mg (has no administration in time range)    Initial Impression / Assessment and Plan / UC Course  I have reviewed the triage vital signs and the nursing notes.  Pertinent labs & imaging results that were available during my care of the patient were reviewed by me and considered in my medical decision making (see chart for details).   1.  Viral URI with cough, nausea without vomiting Symptoms and physical exam consistent with a viral upper respiratory tract infection that will likely resolve with rest, fluids, and prescriptions for symptomatic relief. Deferred imaging based on stable cardiopulmonary exam and hemodynamically stable vital signs. Deferred viral testing using shared decision making as patient is low risk for severe disease related to potential COVID-19 illness and there is low suspicion for influenza. Discussed updated CDC guidelines for masking related to COVID-19.   Patient given zofran 4mg  ODT in clinic today for nausea.  [place]coli well-appearing with hemodynamically stable vital signs.  Does not appear to be dehydrated.  Afebrile currently without any antipyretic in her system. Zofran 4 mg ODT every 8 hours as needed for nausea and Tessalon Perles every 8 hours as needed for cough sent to pharmacy for symptomatic relief to be taken as prescribed.  May continue taking over the counter medications as directed for further symptomatic relief.  Nonpharmacologic interventions for symptom relief provided and after visit  summary below. Advised to push fluids to stay well hydrated while recovering from viral illness.   Discussed physical exam and available lab work findings in clinic with patient.  Counseled patient regarding appropriate use of medications and potential side effects for all medications recommended or prescribed today. Discussed red flag signs and symptoms of worsening condition,when to call the PCP office, return to urgent care, and when to seek higher level of care in the emergency department. Patient verbalizes understanding and agreement with plan. All questions answered. Patient discharged in stable condition.    Final Clinical Impressions(s) / UC Diagnoses   Final diagnoses:  Viral URI with cough  Nausea without vomiting     Discharge Instructions      Your symptoms are most likely due to a viral illness, which will improve on its own with rest and fluids.  We will treat your symptoms with the following: - Zofran 4mg  every 8 hours as needed to help with nausea and vomiting.  Eat a bland diet for the next 12-24 hours (bananas, rice, white toast, and applesauce) once you are able to tolerate liquids (broth, etc). These foods are easy for your stomach to digest. Pedialyte can be purchased to help with rehydration. Drink plenty of water.  - Ibuprofen 600mg  and/or tylenol 1,000mg  every 6 hours as needed for fever/chills and aches/pains. - Guaifenesin (Mucinex) may be purchased over the counter to be taken as directed to reduce nasal congestion/cough - Tessalon perles every 8 hours as needed for coughing  - Two teaspoons of honey (10 ml) can be given by mouth or in warm water every four hours as needed and may decrease the amount of coughing. - Use a cool mist humidifier in your room at night  If you develop any new or worsening symptoms or do not improve in the next 2 to 3 days, please  return.  If your symptoms are severe, please go to the emergency room.  Follow-up with your primary care  provider for further evaluation and management of your symptoms as well as ongoing wellness visits.  I hope you feel better!     ED Prescriptions     Medication Sig Dispense Auth. Provider   benzonatate (TESSALON) 100 MG capsule Take 1 capsule (100 mg total) by mouth every 8 (eight) hours. 21 capsule Reita May M, FNP   ondansetron (ZOFRAN-ODT) 4 MG disintegrating tablet Take 1 tablet (4 mg total) by mouth every 8 (eight) hours as needed for nausea or vomiting. 20 tablet Carlisle Beers, FNP      PDMP not reviewed this encounter.   Carlisle Beers, [place] 01/20/23 1044

## 2023-06-08 ENCOUNTER — Ambulatory Visit (HOSPITAL_COMMUNITY)
Admission: RE | Admit: 2023-06-08 | Discharge: 2023-06-08 | Disposition: A | Discharge status: Home / Self Care | Source: Ambulatory Visit | Attending: Internal Medicine | Admitting: Internal Medicine

## 2023-06-08 ENCOUNTER — Encounter (HOSPITAL_COMMUNITY): Payer: Self-pay

## 2023-06-08 VITALS — BP 131/87 | HR 75 | Temp 99.0°F | Resp 18

## 2023-06-08 DIAGNOSIS — Z1152 Encounter for screening for COVID-19: Secondary | ICD-10-CM | POA: Diagnosis not present

## 2023-06-08 DIAGNOSIS — R52 Pain, unspecified: Secondary | ICD-10-CM | POA: Diagnosis present

## 2023-06-08 DIAGNOSIS — J029 Acute pharyngitis, unspecified: Secondary | ICD-10-CM | POA: Diagnosis present

## 2023-06-08 LAB — POCT RAPID STREP A (OFFICE): Rapid Strep A Screen: NEGATIVE

## 2023-06-08 LAB — SARS CORONAVIRUS 2 (TAT 6-24 HRS): SARS Coronavirus 2: NEGATIVE

## 2023-06-08 NOTE — ED Triage Notes (Signed)
Pt c/o sore throat, chills, fever, body aches, and no appetite x4 days. States taking OTC meds with some relief.

## 2023-06-08 NOTE — Discharge Instructions (Addendum)
Your rapid strep test was negative, we are sending this off for culture and we will contact you if you need antibiotics.  We have tested you for COVID-19 and our staff will contact you if positive, your results will also be available in MyChart.  For symptomatic management of fever, body aches and generalized aches and pains you can alternate between 800 mg of ibuprofen and 500 mg of Tylenol every 4-6 hours.  Ensure you are staying well-hydrated with at least 64 ounces of fluids.  For your sore throat you can do warm saline gargles, tea with honey and sleep with a humidifier.  Return to clinic for new or urgent symptoms.

## 2023-06-08 NOTE — ED Provider Notes (Signed)
[facility]    CSN: [id number] Arrival date & time: 06/08/23  0854      History   Chief Complaint Chief Complaint  Patient presents with   Sore Throat    sore throat, chills, and aches that seem to be persisting - Entered by patient    HPI Erika Medina is a 22 y.o. female.   Patient presents to clinic complaining of sore throat, chills, low-grade fever, generalized bodyaches and diminished appetite for the past 4 days.  She has been taking DayQuil, NyQuil, tea with honey, and peppermints at home.  Denies any known recent sick contacts.  She recently did start grad school.  She also has a nonproductive cough.  She did feel little short of breath last night, does not have any current shortness of breath or wheezing.  No chest pain.  No congestion or nasal drainage.  She does not smoke and does not have a history of asthma or other respiratory illnesses.   The history is provided by the patient and medical records.  Sore Throat Pertinent negatives include no chest pain, no abdominal pain and no shortness of breath.    History reviewed. No pertinent past medical history.  There are no problems to display for this patient.   History reviewed. No pertinent surgical history.  OB History   No obstetric history on file.      Home Medications    Prior to Admission medications   Medication Sig Start Date End Date Taking? Authorizing Provider  norgestimate-ethinyl estradiol (ESTARYLLA) 0.25-35 MG-MCG tablet Take 1 tablet by mouth daily. 07/16/22   Margaretann Loveless, PA-C    Family History History reviewed. No pertinent family history.  Social History Social History   Tobacco Use   Smoking status: Never   Smokeless tobacco: Never  Vaping Use   Vaping status: Never Used  Substance Use Topics   Alcohol use: Never   Drug use: Never     Allergies   Patient has no known allergies.   Review of Systems Review of Systems  Constitutional:  Positive  for appetite change, chills, fatigue and fever.  HENT:  Positive for sore throat. Negative for congestion.   Respiratory:  Positive for cough. Negative for shortness of breath and wheezing.   Cardiovascular:  Negative for chest pain.  Gastrointestinal:  Negative for abdominal pain, diarrhea, nausea and vomiting.     Physical Exam Triage Vital Signs ED Triage Vitals  Encounter Vitals Group     BP 06/08/23 0919 131/87     Systolic BP Percentile --      Diastolic BP Percentile --      Pulse Rate 06/08/23 0919 75     Resp 06/08/23 0919 18     Temp 06/08/23 0919 99 F (37.2 C)     Temp Source 06/08/23 0919 Oral     SpO2 06/08/23 0919 98 %     Weight --      Height --      Head Circumference --      Peak Flow --      Pain Score 06/08/23 0920 7     Pain Loc --      Pain Education --      Exclude from Growth Chart --    No data found.  Updated Vital Signs BP 131/87 (BP Location: Left Arm)   Pulse 75   Temp 99 F (37.2 C) (Oral)   Resp 18   LMP 06/02/2023 (Approximate)   SpO2  98%   Visual Acuity Right Eye Distance:   Left Eye Distance:   Bilateral Distance:    Right Eye Near:   Left Eye Near:    Bilateral Near:     Physical Exam Vitals and nursing note reviewed.  Constitutional:      Appearance: Normal appearance. She is well-developed.  HENT:     Head: Normocephalic and atraumatic.     Right Ear: External ear normal.     Left Ear: External ear normal.     Nose: No congestion or rhinorrhea.     Mouth/Throat:     Pharynx: Uvula midline. Posterior oropharyngeal erythema present.     Tonsils: No tonsillar exudate or tonsillar abscesses. 0 on the right. 0 on the left.  Eyes:     Conjunctiva/sclera: Conjunctivae normal.  Cardiovascular:     Rate and Rhythm: Normal rate and regular rhythm.     Heart sounds: Normal heart sounds. No murmur heard. Pulmonary:     Effort: Pulmonary effort is normal. No respiratory distress.     Breath sounds: Normal breath sounds.   Musculoskeletal:     Cervical back: Normal range of motion.  Skin:    General: Skin is warm and dry.  Neurological:     General: No focal deficit present.     Mental Status: She is alert and oriented to person, place, and time.  Psychiatric:        Mood and Affect: Mood normal.        Behavior: Behavior normal.      UC Treatments / Results  Labs (all labs ordered are listed, but only abnormal results are displayed) Labs Reviewed  SARS CORONAVIRUS 2 (TAT 6-24 HRS)  CULTURE, GROUP A STREP [facility])  POCT RAPID STREP A (OFFICE)    EKG   Radiology No results found.  Procedures Procedures (including critical care time)  Medications Ordered in UC Medications - No data to display  Initial Impression / Assessment and Plan / UC Course  I have reviewed the triage vital signs and the nursing notes.  Pertinent labs & imaging results that were available during my care of the patient were reviewed by me and considered in my medical decision making (see chart for details).  Vitals and triage reviewed, patient is hemodynamically stable.  Posterior pharynx with erythema, uvula midline.  Tonsils without exudate or edema.  Lungs are vesicular, heart with regular rate and rhythm.  Staff swabbed for strep, was negative, will send for culture.  Suspect viral etiology, COVID-19 testing obtained.  Symptomatic management for viral illness discussed.  Plan of care, follow-up care return precautions given, no questions at this time.    Final Clinical Impressions(s) / UC Diagnoses   Final diagnoses:  Pharyngitis, unspecified etiology  Generalized body aches     Discharge Instructions      Your rapid strep test was negative, we are sending this off for culture and we will contact you if you need antibiotics.  We have tested you for COVID-19 and our staff will contact you if positive, your results will also be available in MyChart.  For symptomatic management of fever, body aches and  generalized aches and pains you can alternate between 800 mg of ibuprofen and 500 mg of Tylenol every 4-6 hours.  Ensure you are staying well-hydrated with at least 64 ounces of fluids.  For your sore throat you can do warm saline gargles, tea with honey and sleep with a humidifier.  Return to clinic for new  or urgent symptoms.      ED Prescriptions   None    PDMP not reviewed this encounter.   Rinaldo Ratel [place], [place] 06/08/23 [phone]

## 2023-06-10 LAB — CULTURE, GROUP A STREP (THRC)

## 2023-06-26 IMAGING — DX DG CHEST 1V PORT
1 series · 1 of 1 positions shown · non-contrast
Comparison: None.

CLINICAL DATA: Headache, cough, body aches

EXAM:
PORTABLE CHEST 1 VIEW

[chest]
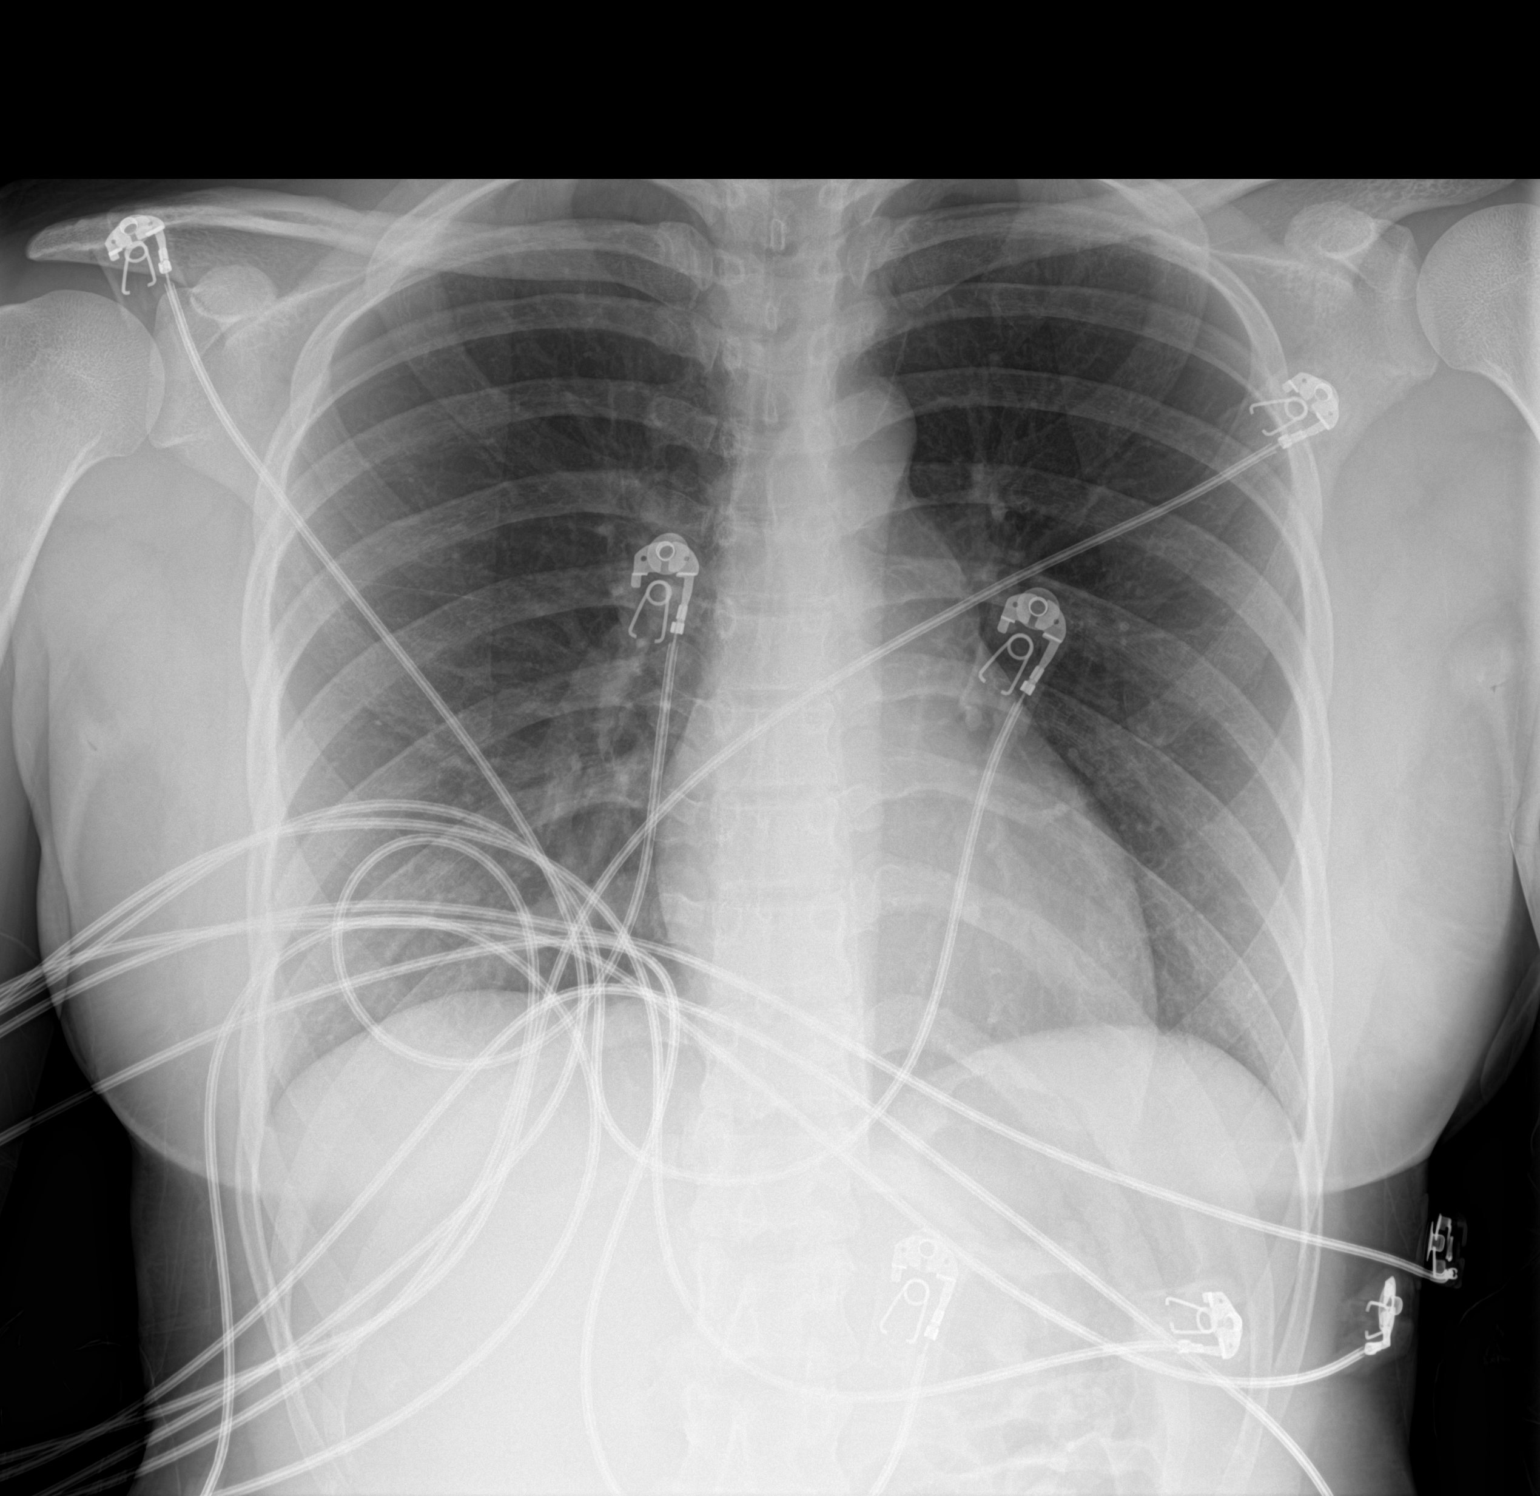

[1 of 1 positions shown; findings below may reference images not displayed]

FINDINGS: The cardiomediastinal silhouette is normal.

There is no focal consolidation or pulmonary edema. There is no
pleural effusion or pneumothorax.

There is no acute osseous abnormality.
IMPRESSION: No radiographic evidence of acute cardiopulmonary process.

## 2023-07-08 ENCOUNTER — Other Ambulatory Visit (INDEPENDENT_AMBULATORY_CARE_PROVIDER_SITE_OTHER): Payer: Self-pay | Admitting: Family

## 2023-08-08 ENCOUNTER — Encounter (HOSPITAL_COMMUNITY): Payer: Self-pay

## 2023-08-08 ENCOUNTER — Ambulatory Visit (HOSPITAL_COMMUNITY)
Admission: RE | Admit: 2023-08-08 | Discharge: 2023-08-08 | Disposition: A | Discharge status: Home / Self Care | Source: Ambulatory Visit | Attending: Family Medicine | Admitting: Family Medicine

## 2023-08-08 VITALS — BP 131/85 | HR 101 | Temp 99.2°F | Resp 16 | Ht 62.0 in | Wt 150.0 lb

## 2023-08-08 DIAGNOSIS — Z113 Encounter for screening for infections with a predominantly sexual mode of transmission: Secondary | ICD-10-CM

## 2023-08-08 NOTE — ED Triage Notes (Signed)
Patient here today to be tested for all STDs after learning that her boyfriend cheated on her with 4 other individuals.

## 2023-08-08 NOTE — Discharge Instructions (Signed)
We have sent testing for sexually transmitted infections. We will notify you of any positive results once they are received. If required, we will prescribe any medications you might need.  Please refrain from all sexual activity for at least the next seven days.

## 2023-08-08 NOTE — ED Provider Notes (Signed)
  [facility]   [id number] 08/08/23 Arrival Time: 1241  ASSESSMENT & PLAN:  1. Screening for STDs (sexually transmitted diseases)       Discharge Instructions      We have sent testing for sexually transmitted infections. We will notify you of any positive results once they are received. If required, we will prescribe any medications you might need.  Please refrain from all sexual activity for at least the next seven days.     Without s/s of PID.  Labs Reviewed  CERVICOVAGINAL ANCILLARY ONLY    Will notify of any positive results.  Reviewed expectations re: course of current medical issues. Questions answered. Outlined signs and symptoms indicating need for more acute intervention. Patient verbalized understanding. After Visit Summary given.   SUBJECTIVE:  Marie Borowski is a 22 y.o. female Patient here today to be tested for all STDs after learning that her boyfriend cheated on her with 4 other individuals.  No symptoms.   Patient's last menstrual period was 07/31/2023 (exact date).   OBJECTIVE:  Vitals:   08/08/23 1301  BP: 131/85  Pulse: (!) 101  Resp: 16  Temp: 99.2 F (37.3 C)  TempSrc: Oral  SpO2: 96%  Weight: 68 kg  Height: 5\' 2"  (1.575 m)     General appearance: alert, cooperative, appears stated age and no distress GU: deferred Skin: warm and dry Psychological: alert and cooperative; normal mood and affect.    Labs Reviewed  CERVICOVAGINAL ANCILLARY ONLY    No Known Allergies  History reviewed. No pertinent past medical history. History reviewed. No pertinent family history. Social History   Socioeconomic History   Marital status: Single    Spouse name: Not on file   Number of children: Not on file   Years of education: Not on file   Highest education level: Not on file  Occupational History   Not on file  Tobacco Use   Smoking status: Never   Smokeless tobacco: Never  Vaping Use   Vaping status: Never Used   Substance and Sexual Activity   Alcohol use: Never   Drug use: Never   Sexual activity: Yes    Birth control/protection: Pill  Other Topics Concern   Not on file  Social History Narrative   Not on file   Social Determinants of Health   Financial Resource Strain: Not on file  Food Insecurity: Not on file  Transportation Needs: Not on file  Physical Activity: Not on file  Stress: Not on file  Social Connections: Not on file  Intimate Partner Violence: Not on file           Mardella Layman, MD 08/08/23 1331

## 2023-08-09 LAB — CERVICOVAGINAL ANCILLARY ONLY
Bacterial Vaginitis (gardnerella): POSITIVE — AB
Candida Glabrata: NEGATIVE
Candida Vaginitis: POSITIVE — AB
Chlamydia: NEGATIVE
Comment: NEGATIVE
Comment: NEGATIVE
Comment: NEGATIVE
Comment: NEGATIVE
Comment: NEGATIVE
Comment: NORMAL
Neisseria Gonorrhea: NEGATIVE
Trichomonas: NEGATIVE

## 2023-08-12 ENCOUNTER — Telehealth (HOSPITAL_COMMUNITY): Payer: Self-pay | Admitting: Emergency Medicine

## 2023-08-12 MED ORDER — METRONIDAZOLE 500 MG PO TABS: 500.0000 mg | ORAL_TABLET | Freq: Two times a day (BID) | ORAL | 0 refills | Status: AC

## 2023-08-12 MED ORDER — FLUCONAZOLE 150 MG PO TABS: 150.0000 mg | ORAL_TABLET | Freq: Once | ORAL | 0 refills | Status: AC

## 2023-08-12 NOTE — Telephone Encounter (Signed)
Metronidazole for BV Diflucan for candida

## 2023-09-05 ENCOUNTER — Other Ambulatory Visit (INDEPENDENT_AMBULATORY_CARE_PROVIDER_SITE_OTHER): Payer: Self-pay | Admitting: Family

## 2023-09-05 DIAGNOSIS — Z3009 Encounter for other general counseling and advice on contraception: Secondary | ICD-10-CM

## 2023-09-30 ENCOUNTER — Encounter (INDEPENDENT_AMBULATORY_CARE_PROVIDER_SITE_OTHER): Payer: Self-pay

## 2024-01-20 ENCOUNTER — Ambulatory Visit

## 2024-01-21 ENCOUNTER — Telehealth: Payer: Self-pay | Admitting: Family Medicine

## 2024-01-21 DIAGNOSIS — R110 Nausea: Secondary | ICD-10-CM

## 2024-01-21 MED ORDER — ONDANSETRON HCL 4 MG PO TABS: 4.0000 mg | ORAL_TABLET | Freq: Three times a day (TID) | ORAL | 0 refills | Status: AC | PRN

## 2024-01-21 NOTE — Patient Instructions (Signed)
 Erika Medina, thank you for joining Erika Roys, PA-C for today's virtual visit.  While this provider is not your primary care provider (PCP), if your PCP is located in our provider database this encounter information will be shared with them immediately following your visit.   A [facility] MyChart account gives you access to today's visit and all your visits, tests, and labs performed at [facility] " click here if you don't have a [facility] MyChart account or go to [email or web address]  Consent: (Patient) Erika Medina provided verbal consent for this virtual visit at the beginning of the encounter.  Current Medications:  Current Outpatient Medications:    ondansetron  (ZOFRAN ) 4 MG tablet, Take 1 tablet (4 mg total) by mouth every 8 (eight) hours as needed for up to 5 days for nausea or vomiting., Disp: 15 tablet, Rfl: 0   metroNIDAZOLE  (FLAGYL ) 500 MG tablet, Take 1 tablet (500 mg total) by mouth 2 (two) times daily., Disp: 14 tablet, Rfl: 0   norgestimate -ethinyl estradiol  (ESTARYLLA) 0.25-35 MG-MCG tablet, Take 1 tablet by mouth daily., Disp: 84 tablet, Rfl: 0   Medications ordered in this encounter:  Meds ordered this encounter  Medications   ondansetron  (ZOFRAN ) 4 MG tablet    Sig: Take 1 tablet (4 mg total) by mouth every 8 (eight) hours as needed for up to 5 days for nausea or vomiting.    Dispense:  15 tablet    Refill:  0     *If you need refills on other medications prior to your next appointment, please contact your pharmacy*  Follow-Up: Call back or seek an in-person evaluation if the symptoms worsen or if the condition fails to improve as anticipated.  [facility] [phone]  Other Instructions Nausea, Adult Nausea is the feeling of having an upset stomach or that you are about to vomit. Nausea on its own is not usually a serious concern, but it may be an early sign of a more serious medical problem. As nausea gets worse, it can  lead to vomiting. If vomiting develops, or if you are not able to drink enough fluids, you are at risk of becoming dehydrated. Dehydration can make you tired and thirsty, cause you to have a dry mouth, and decrease how often you urinate. Older adults and people with other diseases or a weak disease-fighting system (immune system) are at higher risk for dehydration. The main goals of treating your nausea are: To relieve your nausea. To limit repeated nausea episodes. To prevent vomiting and dehydration. Follow these instructions at home: Watch your symptoms for any changes. Tell your health care provider about them. Eating and drinking     Take an oral rehydration solution (ORS). This is a drink that is sold at pharmacies and retail stores. Drink clear fluids slowly and in small amounts as you are able. Clear fluids include water, ice chips, low-calorie sports drinks, and fruit juice that has water added (diluted fruit juice). Eat bland, easy-to-digest foods in small amounts as you are able. These foods include bananas, applesauce, rice, lean meats, toast, and crackers. Avoid drinking fluids that contain a lot of sugar or caffeine, such as energy drinks, sports drinks, and soda. Avoid alcohol. Avoid spicy or fatty foods. General instructions Take over-the-counter and prescription medicines only as told by your health care provider. Rest at home while you recover. Drink enough fluid to keep your urine pale yellow. Breathe slowly and deeply when you feel nauseous. Avoid smelling things that have  strong odors. Wash your hands often using soap and water for at least 20 seconds. If soap and water are not available, use hand sanitizer. Make sure that everyone in your household washes their hands well and often. Keep all follow-up visits. This is important. Contact a health care provider if: Your nausea gets worse. Your nausea does not go away after two days. You vomit multiple times. You  cannot drink fluids without vomiting. You have any of the following: New symptoms. A fever. A headache. Muscle cramps. A rash. Pain while urinating. You feel light-headed or dizzy. Get help right away if: You have pain in your chest, neck, arm, or jaw. You feel extremely weak or you faint. You have vomit that is bright red or looks like coffee grounds. You have bloody or black stools (feces) or stools that look like tar. You have a severe headache, a stiff neck, or both. You have severe pain, cramping, or bloating in your abdomen. You have difficulty breathing or are breathing very quickly. Your heart is beating very quickly. Your skin feels cold and clammy. You feel confused. You have signs of dehydration, such as: Dark urine, very little urine, or no urine. Cracked lips. Dry mouth. Sunken eyes. Sleepiness. Weakness. These symptoms may be an emergency. Get help right away. Call 911. Do not wait to see if the symptoms will go away. Do not drive yourself to the hospital. Summary Nausea is the feeling that you have an upset stomach or that you are about to vomit. Nausea on its own is not usually a serious concern, but it may be an early sign of a more serious medical problem. If vomiting develops, or if you are not able to drink enough fluids, you are at risk of becoming dehydrated. Follow recommendations for eating and drinking and take over-the-counter and prescription medicines only as told by your health care provider. Contact a health care provider right away if your symptoms worsen or you have new symptoms. Keep all follow-up visits. This is important. This information is not intended to replace advice given to you by your health care provider. Make sure you discuss any questions you have with your health care provider. Document Revised: 03/27/2021 Document Reviewed: 03/27/2021 Elsevier Patient Education  2024 Elsevier Inc.   If you have been instructed to have an  in-person evaluation today at a local Urgent Care facility, please use the link below. It will take you to a list of all of our available [facility], including address, phone number and hours of operation. Please do not delay care.  [facility]  If you or a family member do not have a primary care provider, use the link below to schedule a visit and establish care. When you choose a [facility] primary care physician or advanced practice provider, you gain a long-term partner in health. Find a Primary Care Provider  Learn more about [facility] in-office and virtual care options:  - Get Care Now

## 2024-01-21 NOTE — Progress Notes (Signed)
 Virtual Visit Consent   Erika Medina, you are scheduled for a virtual visit with a [facility] provider today. Just as with appointments in the office, your consent must be obtained to participate. Your consent will be active for this visit and any virtual visit you may have with one of our providers in the next 365 days. If you have a MyChart account, a copy of this consent can be sent to you electronically.  As this is a virtual visit, video technology does not allow for your provider to perform a traditional examination. This may limit your provider's ability to fully assess your condition. If your provider identifies any concerns that need to be evaluated in person or the need to arrange testing (such as labs, EKG, etc.), we will make arrangements to do so. Although advances in technology are sophisticated, we cannot ensure that it will always work on either your end or our end. If the connection with a video visit is poor, the visit may have to be switched to a telephone visit. With either a video or telephone visit, we are not always able to ensure that we have a secure connection.  By engaging in this virtual visit, you consent to the provision of healthcare and authorize for your insurance to be billed (if applicable) for the services provided during this visit. Depending on your insurance coverage, you may receive a charge related to this service.  I need to obtain your verbal consent now. Are you willing to proceed with your visit today? Erika Medina has provided verbal consent on 01/21/2024 for a virtual visit (video or telephone). Erika Medina, [place]  Date: 01/21/2024 10:59 AM   Virtual Visit via Video Note   I, Erika Medina, connected with  Erika Medina  ([id number], 01-07-2001) on 01/21/24 at 10:45 AM EDT by a video-enabled telemedicine application and verified that I am speaking with the correct person using two identifiers.  Location: Patient: Virtual Visit Location Patient:  Home Provider: Virtual Visit Location Provider: Home Office   I discussed the limitations of evaluation and management by telemedicine and the availability of in person appointments. The patient expressed understanding and agreed to proceed.    History of Present Illness: Erika Medina is a 23 y.o. who identifies as a female who was assigned female at birth, and is being seen today for c/o having "crazy nausea" for three weeks,  Pt states she is unsure how concerned she should be.  Pt states nausea is random.  Pt states she has only vomited once which was yesterday.  Pt denies concern for pregnancy as she has an IUD and tested negative for at home pregnancy test. Pt states she has not noticed any particular foods that is causing nausea and has been eating bland. Pt denies acid reflux but does report abdominal cramping before the nausea. Pt states she is nauseous currently. Pt denies constipation, diarrhea, fever or chills.   HPI: HPI  Problems: There are no active problems to display for this patient.   Allergies: No Known Allergies Medications:  Current Outpatient Medications:    ondansetron  (ZOFRAN ) 4 MG tablet, Take 1 tablet (4 mg total) by mouth every 8 (eight) hours as needed for up to 5 days for nausea or vomiting., Disp: 15 tablet, Rfl: 0   metroNIDAZOLE  (FLAGYL ) 500 MG tablet, Take 1 tablet (500 mg total) by mouth 2 (two) times daily., Disp: 14 tablet, Rfl: 0   norgestimate -ethinyl estradiol  (ESTARYLLA) 0.25-35 MG-MCG tablet, Take 1 tablet by mouth daily.,  Disp: 84 tablet, Rfl: 0  Observations/Objective: Patient is well-developed, well-nourished in no acute distress.  Resting comfortably at home.  Head is normocephalic, atraumatic.  No labored breathing.  Speech is clear and coherent with logical content.  Patient is alert and oriented at baseline.    Assessment and Plan: 1. Nausea (Primary) - ondansetron  (ZOFRAN ) 4 MG tablet; Take 1 tablet (4 mg total) by mouth every 8 (eight)  hours as needed for up to 5 days for nausea or vomiting.  Dispense: 15 tablet; Refill: 0  -Start short course of zofran   -Advised Pt to follow up with PCP for further evaluation of nausea symptoms  -Pt verbalized understanding.   Follow Up Instructions: I discussed the assessment and treatment plan with the patient. The patient was provided an opportunity to ask questions and all were answered. The patient agreed with the plan and demonstrated an understanding of the instructions.  A copy of instructions were sent to the patient via MyChart unless otherwise noted below.     The patient was advised to call back or seek an in-person evaluation if the symptoms worsen or if the condition fails to improve as anticipated.    Erika Roys, PA-C

## 2024-05-22 ENCOUNTER — Telehealth: Payer: Self-pay | Admitting: Emergency Medicine

## 2024-05-22 DIAGNOSIS — N939 Abnormal uterine and vaginal bleeding, unspecified: Secondary | ICD-10-CM

## 2024-05-22 NOTE — Progress Notes (Signed)
 Virtual Visit Consent   Erika Medina, you are scheduled for a virtual visit with a [facility] provider today. Just as with appointments in the office, your consent must be obtained to participate. Your consent will be active for this visit and any virtual visit you may have with one of our providers in the next 365 days. If you have a MyChart account, a copy of this consent can be sent to you electronically.  As this is a virtual visit, video technology does not allow for your provider to perform a traditional examination. This may limit your provider's ability to fully assess your condition. If your provider identifies any concerns that need to be evaluated in person or the need to arrange testing (such as labs, EKG, etc.), we will make arrangements to do so. Although advances in technology are sophisticated, we cannot ensure that it will always work on either your end or our end. If the connection with a video visit is poor, the visit may have to be switched to a telephone visit. With either a video or telephone visit, we are not always able to ensure that we have a secure connection.  By engaging in this virtual visit, you consent to the provision of healthcare and authorize for your insurance to be billed (if applicable) for the services provided during this visit. Depending on your insurance coverage, you may receive a charge related to this service.  I need to obtain your verbal consent now. Are you willing to proceed with your visit today? Erika Medina has provided verbal consent on 05/22/2024 for a virtual visit (video or telephone). [name], NP  Date: 05/22/2024 2:52 PM   Virtual Visit via Video Note   I, [name], connected with  Erika Medina  ([id number], 2001-06-28) on 05/22/24 at  2:15 PM EDT by a video-enabled telemedicine application and verified that I am speaking with the correct person using two identifiers.  Location: Patient: Virtual Visit Location Patient:  Home Provider: Virtual Visit Location Provider: Home Office   I discussed the limitations of evaluation and management by telemedicine and the availability of in person appointments. The patient expressed understanding and agreed to proceed.    History of Present Illness: Erika Medina is a 23 y.o. who identifies as a female who was assigned female at birth, and is being seen today for heavy and prolonged menstrual bleeding.   Stopped had paraguard inserted last Fall. Has never been able to feel strings.   Usually gets a menstrual period every month for 4-5 days. This menstrual cycle is weird. . This one started 3 days early, should have ended by now.  At day 7 and still bleeding and still bleeding through a regular sized tampon every 1.5 hours.   Has cramps and abd pain but they are mild and not taking any medicine to help manage them.   HPI: HPI  Problems: There are no active problems to display for this patient.   Allergies: No Known Allergies Medications:  Current Outpatient Medications:    metroNIDAZOLE  (FLAGYL ) 500 MG tablet, Take 1 tablet (500 mg total) by mouth 2 (two) times daily., Disp: 14 tablet, Rfl: 0   norgestimate -ethinyl estradiol  (ESTARYLLA) 0.25-35 MG-MCG tablet, Take 1 tablet by mouth daily., Disp: 84 tablet, Rfl: 0  Observations/Objective: Patient is well-developed, well-nourished in no acute distress.  Resting comfortably  at home.  Head is normocephalic, atraumatic.  No labored breathing.  Speech is clear and coherent with logical content.  Patient is  alert and oriented at baseline.    Assessment and Plan: 1. Vaginal bleeding (Primary)  I recommend she go to urgent care to make sure IUD in place, get pregnancy test. It's possible heavy bleeding is not abnormal but she has had IUD for >8 months and I wouldn't expect this level of bleeding to occur  Follow Up Instructions: I discussed the assessment and treatment plan with the patient. The patient was provided  an opportunity to ask questions and all were answered. The patient agreed with the plan and demonstrated an understanding of the instructions.  A copy of instructions were sent to the patient via MyChart unless otherwise noted below.   The patient was advised to call back or seek an in-person evaluation if the symptoms worsen or if the condition fails to improve as anticipated.    [name], NP

## 2024-05-22 NOTE — Patient Instructions (Signed)
  Erika Medina, thank you for joining Erika CHRISTELLA Belt, NP for today's virtual visit.  While this provider is not your primary care provider (PCP), if your PCP is located in our provider database this encounter information will be shared with them immediately following your visit.   A [facility] MyChart account gives you access to today's visit and all your visits, tests, and labs performed at [facility]  click here if you don't have a [facility] MyChart account or go to [email or web address]  Consent: (Patient) Erika Medina provided verbal consent for this virtual visit at the beginning of the encounter.  Current Medications:  Current Outpatient Medications:    metroNIDAZOLE  (FLAGYL ) 500 MG tablet, Take 1 tablet (500 mg total) by mouth 2 (two) times daily., Disp: 14 tablet, Rfl: 0   norgestimate -ethinyl estradiol  (ESTARYLLA) 0.25-35 MG-MCG tablet, Take 1 tablet by mouth daily., Disp: 84 tablet, Rfl: 0   Medications ordered in this encounter:  No orders of the defined types were placed in this encounter.    *If you need refills on other medications prior to your next appointment, please contact your pharmacy*  Follow-Up: Call back or seek an in-person evaluation if the symptoms worsen or if the condition fails to improve as anticipated.  [facility] [phone]  Other Instructions  Please be seen in person at an urgent care. They can make sure your IUD is in place with an x-ray and do a pregnancy test.    If you have been instructed to have an in-person evaluation today at a local Urgent Care facility, please use the link below. It will take you to a list of all of our available [facility], including address, phone number and hours of operation. Please do not delay care.  [facility]  If you or a family member do not have a primary care provider, use the link below to schedule a visit and establish care. When you choose a  [facility] primary care physician or advanced practice provider, you gain a long-term partner in health. Find a Primary Care Provider  Learn more about [facility]'s in-office and virtual care options: [facility] - Get Care Now

## 2024-07-06 ENCOUNTER — Other Ambulatory Visit (INDEPENDENT_AMBULATORY_CARE_PROVIDER_SITE_OTHER): Payer: Self-pay | Admitting: Family
